# Patient Record
Sex: Female | Born: 1945 | Hispanic: Refuse to answer | Marital: Married | Smoking: Never smoker
Health system: Southern US, Community
[De-identification: ages and names within clinical notes are randomized; demographics above are authoritative.]

## PROBLEM LIST (undated history)

## (undated) DIAGNOSIS — E785 Hyperlipidemia, unspecified: Secondary | ICD-10-CM

## (undated) DIAGNOSIS — I10 Essential (primary) hypertension: Secondary | ICD-10-CM

## (undated) HISTORY — PX: BREAST EXCISIONAL BIOPSY: SUR124

## (undated) HISTORY — DX: Essential (primary) hypertension: I10

## (undated) HISTORY — DX: Hyperlipidemia, unspecified: E78.5

---

## 1971-02-10 HISTORY — PX: BREAST EXCISIONAL BIOPSY: SUR124

## 1998-02-09 HISTORY — PX: ABDOMINAL HYSTERECTOMY: SHX81

## 2008-02-10 HISTORY — PX: COLONOSCOPY: SHX174

## 2010-03-05 ENCOUNTER — Other Ambulatory Visit: Payer: Self-pay | Admitting: Internal Medicine

## 2010-03-05 DIAGNOSIS — Z1239 Encounter for other screening for malignant neoplasm of breast: Secondary | ICD-10-CM

## 2010-03-13 ENCOUNTER — Ambulatory Visit: Payer: Self-pay

## 2010-03-18 ENCOUNTER — Ambulatory Visit: Payer: Self-pay

## 2010-03-26 ENCOUNTER — Ambulatory Visit: Payer: Self-pay

## 2010-07-15 ENCOUNTER — Other Ambulatory Visit: Payer: Self-pay | Admitting: Family Medicine

## 2010-07-15 DIAGNOSIS — Z1239 Encounter for other screening for malignant neoplasm of breast: Secondary | ICD-10-CM

## 2010-07-15 DIAGNOSIS — Z780 Asymptomatic menopausal state: Secondary | ICD-10-CM

## 2010-07-23 ENCOUNTER — Other Ambulatory Visit: Payer: Self-pay

## 2010-07-23 ENCOUNTER — Ambulatory Visit
Admission: RE | Admit: 2010-07-23 | Discharge: 2010-07-23 | Disposition: A | Discharge status: Home / Self Care | Payer: BC Managed Care – PPO | Source: Ambulatory Visit | Attending: Family Medicine | Admitting: Family Medicine

## 2010-07-23 DIAGNOSIS — Z1239 Encounter for other screening for malignant neoplasm of breast: Secondary | ICD-10-CM

## 2011-10-22 ENCOUNTER — Other Ambulatory Visit: Payer: Self-pay | Admitting: Family Medicine

## 2011-10-22 DIAGNOSIS — Z780 Asymptomatic menopausal state: Secondary | ICD-10-CM

## 2011-10-22 DIAGNOSIS — Z1231 Encounter for screening mammogram for malignant neoplasm of breast: Secondary | ICD-10-CM

## 2011-11-17 ENCOUNTER — Other Ambulatory Visit: Payer: BC Managed Care – PPO

## 2011-11-17 ENCOUNTER — Ambulatory Visit: Payer: BC Managed Care – PPO

## 2011-12-15 ENCOUNTER — Ambulatory Visit
Admission: RE | Admit: 2011-12-15 | Discharge: 2011-12-15 | Disposition: A | Discharge status: Home / Self Care | Payer: Managed Care, Other (non HMO) | Source: Ambulatory Visit | Attending: Family Medicine | Admitting: Family Medicine

## 2011-12-15 DIAGNOSIS — Z1231 Encounter for screening mammogram for malignant neoplasm of breast: Secondary | ICD-10-CM

## 2011-12-15 DIAGNOSIS — Z780 Asymptomatic menopausal state: Secondary | ICD-10-CM

## 2012-11-28 ENCOUNTER — Other Ambulatory Visit: Payer: Self-pay | Admitting: Family Medicine

## 2012-11-28 DIAGNOSIS — Z1231 Encounter for screening mammogram for malignant neoplasm of breast: Secondary | ICD-10-CM

## 2012-12-16 ENCOUNTER — Ambulatory Visit
Admission: RE | Admit: 2012-12-16 | Discharge: 2012-12-16 | Disposition: A | Discharge status: Home / Self Care | Payer: Medicare Other | Source: Ambulatory Visit | Attending: Family Medicine | Admitting: Family Medicine

## 2012-12-16 DIAGNOSIS — Z1231 Encounter for screening mammogram for malignant neoplasm of breast: Secondary | ICD-10-CM

## 2013-02-09 HISTORY — PX: MENISCUS REPAIR: SHX5179

## 2013-12-26 ENCOUNTER — Other Ambulatory Visit: Payer: Self-pay

## 2013-12-26 DIAGNOSIS — Z1231 Encounter for screening mammogram for malignant neoplasm of breast: Secondary | ICD-10-CM

## 2013-12-27 ENCOUNTER — Ambulatory Visit
Admission: RE | Admit: 2013-12-27 | Discharge: 2013-12-27 | Disposition: A | Discharge status: Home / Self Care | Payer: Commercial Managed Care - HMO | Source: Ambulatory Visit

## 2013-12-27 DIAGNOSIS — Z1231 Encounter for screening mammogram for malignant neoplasm of breast: Secondary | ICD-10-CM

## 2014-04-09 DIAGNOSIS — I10 Essential (primary) hypertension: Secondary | ICD-10-CM | POA: Diagnosis not present

## 2014-04-09 DIAGNOSIS — F330 Major depressive disorder, recurrent, mild: Secondary | ICD-10-CM | POA: Diagnosis not present

## 2014-04-09 DIAGNOSIS — R0789 Other chest pain: Secondary | ICD-10-CM | POA: Diagnosis not present

## 2014-04-09 DIAGNOSIS — R5383 Other fatigue: Secondary | ICD-10-CM | POA: Diagnosis not present

## 2014-04-11 DIAGNOSIS — R9431 Abnormal electrocardiogram [ECG] [EKG]: Secondary | ICD-10-CM | POA: Diagnosis not present

## 2014-04-11 DIAGNOSIS — R079 Chest pain, unspecified: Secondary | ICD-10-CM | POA: Diagnosis not present

## 2014-04-11 DIAGNOSIS — E782 Mixed hyperlipidemia: Secondary | ICD-10-CM | POA: Diagnosis not present

## 2014-04-13 DIAGNOSIS — R002 Palpitations: Secondary | ICD-10-CM | POA: Diagnosis not present

## 2014-04-13 DIAGNOSIS — R0789 Other chest pain: Secondary | ICD-10-CM | POA: Diagnosis not present

## 2014-04-13 DIAGNOSIS — R9431 Abnormal electrocardiogram [ECG] [EKG]: Secondary | ICD-10-CM | POA: Diagnosis not present

## 2014-04-13 DIAGNOSIS — I10 Essential (primary) hypertension: Secondary | ICD-10-CM | POA: Diagnosis not present

## 2014-05-03 DIAGNOSIS — L821 Other seborrheic keratosis: Secondary | ICD-10-CM | POA: Diagnosis not present

## 2014-05-08 DIAGNOSIS — H40013 Open angle with borderline findings, low risk, bilateral: Secondary | ICD-10-CM | POA: Diagnosis not present

## 2014-06-26 DIAGNOSIS — F330 Major depressive disorder, recurrent, mild: Secondary | ICD-10-CM | POA: Diagnosis not present

## 2014-06-26 DIAGNOSIS — E782 Mixed hyperlipidemia: Secondary | ICD-10-CM | POA: Diagnosis not present

## 2014-06-26 DIAGNOSIS — I10 Essential (primary) hypertension: Secondary | ICD-10-CM | POA: Diagnosis not present

## 2014-06-26 DIAGNOSIS — R0789 Other chest pain: Secondary | ICD-10-CM | POA: Diagnosis not present

## 2014-09-10 DIAGNOSIS — I10 Essential (primary) hypertension: Secondary | ICD-10-CM | POA: Diagnosis not present

## 2014-09-10 DIAGNOSIS — R32 Unspecified urinary incontinence: Secondary | ICD-10-CM | POA: Diagnosis not present

## 2014-09-10 DIAGNOSIS — N952 Postmenopausal atrophic vaginitis: Secondary | ICD-10-CM | POA: Diagnosis not present

## 2014-12-19 DIAGNOSIS — E782 Mixed hyperlipidemia: Secondary | ICD-10-CM | POA: Diagnosis not present

## 2014-12-19 DIAGNOSIS — Z0000 Encounter for general adult medical examination without abnormal findings: Secondary | ICD-10-CM | POA: Diagnosis not present

## 2014-12-19 DIAGNOSIS — M7072 Other bursitis of hip, left hip: Secondary | ICD-10-CM | POA: Diagnosis not present

## 2014-12-19 DIAGNOSIS — M791 Myalgia: Secondary | ICD-10-CM | POA: Diagnosis not present

## 2014-12-19 DIAGNOSIS — I10 Essential (primary) hypertension: Secondary | ICD-10-CM | POA: Diagnosis not present

## 2015-01-10 DIAGNOSIS — M7072 Other bursitis of hip, left hip: Secondary | ICD-10-CM | POA: Diagnosis not present

## 2015-02-13 DIAGNOSIS — M791 Myalgia: Secondary | ICD-10-CM | POA: Diagnosis not present

## 2015-02-13 DIAGNOSIS — E782 Mixed hyperlipidemia: Secondary | ICD-10-CM | POA: Diagnosis not present

## 2015-02-13 DIAGNOSIS — I10 Essential (primary) hypertension: Secondary | ICD-10-CM | POA: Diagnosis not present

## 2015-02-15 DIAGNOSIS — M169 Osteoarthritis of hip, unspecified: Secondary | ICD-10-CM | POA: Diagnosis not present

## 2015-02-15 DIAGNOSIS — E782 Mixed hyperlipidemia: Secondary | ICD-10-CM | POA: Diagnosis not present

## 2015-02-15 DIAGNOSIS — I10 Essential (primary) hypertension: Secondary | ICD-10-CM | POA: Diagnosis not present

## 2015-02-22 ENCOUNTER — Other Ambulatory Visit: Payer: Self-pay

## 2015-02-22 DIAGNOSIS — Z1231 Encounter for screening mammogram for malignant neoplasm of breast: Secondary | ICD-10-CM

## 2015-03-06 ENCOUNTER — Ambulatory Visit: Payer: Commercial Managed Care - HMO

## 2015-03-13 ENCOUNTER — Ambulatory Visit
Admission: RE | Admit: 2015-03-13 | Discharge: 2015-03-13 | Disposition: A | Discharge status: Home / Self Care | Payer: Commercial Managed Care - HMO | Source: Ambulatory Visit

## 2015-03-13 DIAGNOSIS — Z1231 Encounter for screening mammogram for malignant neoplasm of breast: Secondary | ICD-10-CM

## 2015-05-08 DIAGNOSIS — I10 Essential (primary) hypertension: Secondary | ICD-10-CM | POA: Diagnosis not present

## 2015-05-08 DIAGNOSIS — E782 Mixed hyperlipidemia: Secondary | ICD-10-CM | POA: Diagnosis not present

## 2015-05-10 DIAGNOSIS — H40013 Open angle with borderline findings, low risk, bilateral: Secondary | ICD-10-CM | POA: Diagnosis not present

## 2015-05-10 DIAGNOSIS — H04123 Dry eye syndrome of bilateral lacrimal glands: Secondary | ICD-10-CM | POA: Diagnosis not present

## 2015-05-10 DIAGNOSIS — H16223 Keratoconjunctivitis sicca, not specified as Sjogren's, bilateral: Secondary | ICD-10-CM | POA: Diagnosis not present

## 2015-07-01 DIAGNOSIS — M7072 Other bursitis of hip, left hip: Secondary | ICD-10-CM | POA: Diagnosis not present

## 2015-07-01 DIAGNOSIS — E782 Mixed hyperlipidemia: Secondary | ICD-10-CM | POA: Diagnosis not present

## 2015-08-07 DIAGNOSIS — E782 Mixed hyperlipidemia: Secondary | ICD-10-CM | POA: Diagnosis not present

## 2015-08-09 DIAGNOSIS — I10 Essential (primary) hypertension: Secondary | ICD-10-CM | POA: Diagnosis not present

## 2015-08-09 DIAGNOSIS — E782 Mixed hyperlipidemia: Secondary | ICD-10-CM | POA: Diagnosis not present

## 2015-08-09 DIAGNOSIS — J309 Allergic rhinitis, unspecified: Secondary | ICD-10-CM | POA: Diagnosis not present

## 2015-09-02 DIAGNOSIS — J309 Allergic rhinitis, unspecified: Secondary | ICD-10-CM | POA: Diagnosis not present

## 2015-09-02 DIAGNOSIS — J329 Chronic sinusitis, unspecified: Secondary | ICD-10-CM | POA: Diagnosis not present

## 2015-09-02 DIAGNOSIS — H04123 Dry eye syndrome of bilateral lacrimal glands: Secondary | ICD-10-CM | POA: Diagnosis not present

## 2015-09-12 DIAGNOSIS — R350 Frequency of micturition: Secondary | ICD-10-CM | POA: Diagnosis not present

## 2015-09-12 DIAGNOSIS — R300 Dysuria: Secondary | ICD-10-CM | POA: Diagnosis not present

## 2015-09-12 DIAGNOSIS — R8299 Other abnormal findings in urine: Secondary | ICD-10-CM | POA: Diagnosis not present

## 2015-10-24 DIAGNOSIS — K5792 Diverticulitis of intestine, part unspecified, without perforation or abscess without bleeding: Secondary | ICD-10-CM | POA: Diagnosis not present

## 2015-10-24 DIAGNOSIS — Z23 Encounter for immunization: Secondary | ICD-10-CM | POA: Diagnosis not present

## 2015-10-28 DIAGNOSIS — K5792 Diverticulitis of intestine, part unspecified, without perforation or abscess without bleeding: Secondary | ICD-10-CM | POA: Diagnosis not present

## 2015-11-05 DIAGNOSIS — I10 Essential (primary) hypertension: Secondary | ICD-10-CM | POA: Diagnosis not present

## 2015-11-05 DIAGNOSIS — E782 Mixed hyperlipidemia: Secondary | ICD-10-CM | POA: Diagnosis not present

## 2015-11-07 DIAGNOSIS — E782 Mixed hyperlipidemia: Secondary | ICD-10-CM | POA: Diagnosis not present

## 2015-11-07 DIAGNOSIS — I10 Essential (primary) hypertension: Secondary | ICD-10-CM | POA: Diagnosis not present

## 2015-11-07 DIAGNOSIS — J309 Allergic rhinitis, unspecified: Secondary | ICD-10-CM | POA: Diagnosis not present

## 2015-12-13 DIAGNOSIS — Z6836 Body mass index (BMI) 36.0-36.9, adult: Secondary | ICD-10-CM | POA: Diagnosis not present

## 2015-12-13 DIAGNOSIS — J309 Allergic rhinitis, unspecified: Secondary | ICD-10-CM | POA: Diagnosis not present

## 2016-01-14 DIAGNOSIS — H04123 Dry eye syndrome of bilateral lacrimal glands: Secondary | ICD-10-CM | POA: Diagnosis not present

## 2016-01-14 DIAGNOSIS — I10 Essential (primary) hypertension: Secondary | ICD-10-CM | POA: Diagnosis not present

## 2016-01-14 DIAGNOSIS — Z6836 Body mass index (BMI) 36.0-36.9, adult: Secondary | ICD-10-CM | POA: Diagnosis not present

## 2016-01-14 DIAGNOSIS — Z23 Encounter for immunization: Secondary | ICD-10-CM | POA: Diagnosis not present

## 2016-01-14 DIAGNOSIS — J309 Allergic rhinitis, unspecified: Secondary | ICD-10-CM | POA: Diagnosis not present

## 2016-01-14 DIAGNOSIS — Z0000 Encounter for general adult medical examination without abnormal findings: Secondary | ICD-10-CM | POA: Diagnosis not present

## 2016-01-27 DIAGNOSIS — Z6836 Body mass index (BMI) 36.0-36.9, adult: Secondary | ICD-10-CM | POA: Diagnosis not present

## 2016-01-27 DIAGNOSIS — R05 Cough: Secondary | ICD-10-CM | POA: Diagnosis not present

## 2016-03-20 DIAGNOSIS — B349 Viral infection, unspecified: Secondary | ICD-10-CM | POA: Diagnosis not present

## 2016-03-20 DIAGNOSIS — N390 Urinary tract infection, site not specified: Secondary | ICD-10-CM | POA: Diagnosis not present

## 2016-05-04 ENCOUNTER — Other Ambulatory Visit: Payer: Self-pay | Admitting: Family Medicine

## 2016-05-04 DIAGNOSIS — Z1231 Encounter for screening mammogram for malignant neoplasm of breast: Secondary | ICD-10-CM

## 2016-05-27 ENCOUNTER — Ambulatory Visit
Admission: RE | Admit: 2016-05-27 | Discharge: 2016-05-27 | Disposition: A | Discharge status: Home / Self Care | Payer: Medicare HMO | Source: Ambulatory Visit | Attending: Family Medicine | Admitting: Family Medicine

## 2016-05-27 DIAGNOSIS — Z1231 Encounter for screening mammogram for malignant neoplasm of breast: Secondary | ICD-10-CM | POA: Diagnosis not present

## 2016-07-15 DIAGNOSIS — E782 Mixed hyperlipidemia: Secondary | ICD-10-CM | POA: Diagnosis not present

## 2016-07-15 DIAGNOSIS — I10 Essential (primary) hypertension: Secondary | ICD-10-CM | POA: Diagnosis not present

## 2016-07-17 DIAGNOSIS — I10 Essential (primary) hypertension: Secondary | ICD-10-CM | POA: Diagnosis not present

## 2016-07-17 DIAGNOSIS — M7072 Other bursitis of hip, left hip: Secondary | ICD-10-CM | POA: Diagnosis not present

## 2016-07-17 DIAGNOSIS — E782 Mixed hyperlipidemia: Secondary | ICD-10-CM | POA: Diagnosis not present

## 2016-07-17 DIAGNOSIS — Z23 Encounter for immunization: Secondary | ICD-10-CM | POA: Diagnosis not present

## 2016-11-05 DIAGNOSIS — E782 Mixed hyperlipidemia: Secondary | ICD-10-CM | POA: Diagnosis not present

## 2016-11-09 DIAGNOSIS — I10 Essential (primary) hypertension: Secondary | ICD-10-CM | POA: Diagnosis not present

## 2016-11-09 DIAGNOSIS — Z6836 Body mass index (BMI) 36.0-36.9, adult: Secondary | ICD-10-CM | POA: Diagnosis not present

## 2016-11-09 DIAGNOSIS — Z23 Encounter for immunization: Secondary | ICD-10-CM | POA: Diagnosis not present

## 2016-11-09 DIAGNOSIS — E782 Mixed hyperlipidemia: Secondary | ICD-10-CM | POA: Diagnosis not present

## 2016-11-27 DIAGNOSIS — H16223 Keratoconjunctivitis sicca, not specified as Sjogren's, bilateral: Secondary | ICD-10-CM | POA: Diagnosis not present

## 2016-11-27 DIAGNOSIS — H40013 Open angle with borderline findings, low risk, bilateral: Secondary | ICD-10-CM | POA: Diagnosis not present

## 2016-12-15 DIAGNOSIS — K5792 Diverticulitis of intestine, part unspecified, without perforation or abscess without bleeding: Secondary | ICD-10-CM | POA: Diagnosis not present

## 2016-12-15 DIAGNOSIS — Z6836 Body mass index (BMI) 36.0-36.9, adult: Secondary | ICD-10-CM | POA: Diagnosis not present

## 2016-12-17 DIAGNOSIS — K5792 Diverticulitis of intestine, part unspecified, without perforation or abscess without bleeding: Secondary | ICD-10-CM | POA: Diagnosis not present

## 2017-01-15 DIAGNOSIS — E782 Mixed hyperlipidemia: Secondary | ICD-10-CM | POA: Diagnosis not present

## 2017-01-15 DIAGNOSIS — I10 Essential (primary) hypertension: Secondary | ICD-10-CM | POA: Diagnosis not present

## 2017-01-15 DIAGNOSIS — E559 Vitamin D deficiency, unspecified: Secondary | ICD-10-CM | POA: Diagnosis not present

## 2017-01-21 ENCOUNTER — Other Ambulatory Visit: Payer: Self-pay | Admitting: Family Medicine

## 2017-01-21 DIAGNOSIS — Z1231 Encounter for screening mammogram for malignant neoplasm of breast: Secondary | ICD-10-CM

## 2017-01-21 DIAGNOSIS — E782 Mixed hyperlipidemia: Secondary | ICD-10-CM | POA: Diagnosis not present

## 2017-01-21 DIAGNOSIS — Z6836 Body mass index (BMI) 36.0-36.9, adult: Secondary | ICD-10-CM | POA: Diagnosis not present

## 2017-01-21 DIAGNOSIS — E2839 Other primary ovarian failure: Secondary | ICD-10-CM

## 2017-01-21 DIAGNOSIS — R112 Nausea with vomiting, unspecified: Secondary | ICD-10-CM

## 2017-01-21 DIAGNOSIS — E559 Vitamin D deficiency, unspecified: Secondary | ICD-10-CM | POA: Diagnosis not present

## 2017-01-21 DIAGNOSIS — Z1211 Encounter for screening for malignant neoplasm of colon: Secondary | ICD-10-CM | POA: Diagnosis not present

## 2017-01-21 DIAGNOSIS — I10 Essential (primary) hypertension: Secondary | ICD-10-CM | POA: Diagnosis not present

## 2017-01-21 DIAGNOSIS — Z23 Encounter for immunization: Secondary | ICD-10-CM | POA: Diagnosis not present

## 2017-01-21 DIAGNOSIS — R109 Unspecified abdominal pain: Secondary | ICD-10-CM

## 2017-01-21 DIAGNOSIS — Z0000 Encounter for general adult medical examination without abnormal findings: Secondary | ICD-10-CM | POA: Diagnosis not present

## 2017-01-22 ENCOUNTER — Ambulatory Visit
Admission: RE | Admit: 2017-01-22 | Discharge: 2017-01-22 | Disposition: A | Discharge status: Home / Self Care | Payer: Medicare HMO | Source: Ambulatory Visit | Attending: Family Medicine | Admitting: Family Medicine

## 2017-01-22 DIAGNOSIS — R112 Nausea with vomiting, unspecified: Secondary | ICD-10-CM

## 2017-01-22 DIAGNOSIS — R109 Unspecified abdominal pain: Secondary | ICD-10-CM

## 2017-01-22 DIAGNOSIS — K8020 Calculus of gallbladder without cholecystitis without obstruction: Secondary | ICD-10-CM | POA: Diagnosis not present

## 2017-01-25 ENCOUNTER — Other Ambulatory Visit: Payer: Medicare HMO

## 2017-01-26 DIAGNOSIS — K8020 Calculus of gallbladder without cholecystitis without obstruction: Secondary | ICD-10-CM | POA: Diagnosis not present

## 2017-01-26 DIAGNOSIS — R1030 Lower abdominal pain, unspecified: Secondary | ICD-10-CM | POA: Diagnosis not present

## 2017-01-26 DIAGNOSIS — Z6836 Body mass index (BMI) 36.0-36.9, adult: Secondary | ICD-10-CM | POA: Diagnosis not present

## 2017-01-26 DIAGNOSIS — I10 Essential (primary) hypertension: Secondary | ICD-10-CM | POA: Diagnosis not present

## 2017-02-17 DIAGNOSIS — R109 Unspecified abdominal pain: Secondary | ICD-10-CM | POA: Diagnosis not present

## 2017-03-22 DIAGNOSIS — Z6836 Body mass index (BMI) 36.0-36.9, adult: Secondary | ICD-10-CM | POA: Diagnosis not present

## 2017-03-22 DIAGNOSIS — K8020 Calculus of gallbladder without cholecystitis without obstruction: Secondary | ICD-10-CM | POA: Diagnosis not present

## 2017-03-22 DIAGNOSIS — R1030 Lower abdominal pain, unspecified: Secondary | ICD-10-CM | POA: Diagnosis not present

## 2017-03-30 DIAGNOSIS — H401231 Low-tension glaucoma, bilateral, mild stage: Secondary | ICD-10-CM | POA: Diagnosis not present

## 2017-04-13 DIAGNOSIS — M1611 Unilateral primary osteoarthritis, right hip: Secondary | ICD-10-CM | POA: Diagnosis not present

## 2017-04-13 DIAGNOSIS — M25551 Pain in right hip: Secondary | ICD-10-CM | POA: Diagnosis not present

## 2017-04-13 DIAGNOSIS — M25561 Pain in right knee: Secondary | ICD-10-CM | POA: Diagnosis not present

## 2017-04-13 DIAGNOSIS — M1711 Unilateral primary osteoarthritis, right knee: Secondary | ICD-10-CM | POA: Diagnosis not present

## 2017-04-21 DIAGNOSIS — E782 Mixed hyperlipidemia: Secondary | ICD-10-CM | POA: Diagnosis not present

## 2017-04-26 DIAGNOSIS — H409 Unspecified glaucoma: Secondary | ICD-10-CM | POA: Diagnosis not present

## 2017-04-26 DIAGNOSIS — E782 Mixed hyperlipidemia: Secondary | ICD-10-CM | POA: Diagnosis not present

## 2017-04-26 DIAGNOSIS — I10 Essential (primary) hypertension: Secondary | ICD-10-CM | POA: Diagnosis not present

## 2017-04-26 DIAGNOSIS — Z6836 Body mass index (BMI) 36.0-36.9, adult: Secondary | ICD-10-CM | POA: Diagnosis not present

## 2017-04-26 DIAGNOSIS — K8020 Calculus of gallbladder without cholecystitis without obstruction: Secondary | ICD-10-CM | POA: Diagnosis not present

## 2017-05-03 DIAGNOSIS — H409 Unspecified glaucoma: Secondary | ICD-10-CM | POA: Diagnosis not present

## 2017-05-03 DIAGNOSIS — K8020 Calculus of gallbladder without cholecystitis without obstruction: Secondary | ICD-10-CM | POA: Diagnosis not present

## 2017-05-03 DIAGNOSIS — I10 Essential (primary) hypertension: Secondary | ICD-10-CM | POA: Diagnosis not present

## 2017-05-03 DIAGNOSIS — K219 Gastro-esophageal reflux disease without esophagitis: Secondary | ICD-10-CM | POA: Diagnosis not present

## 2017-05-20 ENCOUNTER — Other Ambulatory Visit: Payer: Self-pay | Admitting: General Surgery

## 2017-05-20 DIAGNOSIS — K8020 Calculus of gallbladder without cholecystitis without obstruction: Secondary | ICD-10-CM | POA: Diagnosis not present

## 2017-05-28 ENCOUNTER — Ambulatory Visit: Payer: Medicare HMO

## 2017-05-28 ENCOUNTER — Other Ambulatory Visit: Payer: Medicare HMO

## 2017-06-07 DIAGNOSIS — I10 Essential (primary) hypertension: Secondary | ICD-10-CM | POA: Diagnosis not present

## 2017-06-07 DIAGNOSIS — Z6836 Body mass index (BMI) 36.0-36.9, adult: Secondary | ICD-10-CM | POA: Diagnosis not present

## 2017-06-07 DIAGNOSIS — K219 Gastro-esophageal reflux disease without esophagitis: Secondary | ICD-10-CM | POA: Diagnosis not present

## 2017-06-07 DIAGNOSIS — K8020 Calculus of gallbladder without cholecystitis without obstruction: Secondary | ICD-10-CM | POA: Diagnosis not present

## 2017-06-25 ENCOUNTER — Ambulatory Visit
Admission: RE | Admit: 2017-06-25 | Discharge: 2017-06-25 | Disposition: A | Discharge status: Home / Self Care | Payer: Medicare HMO | Source: Ambulatory Visit | Attending: Family Medicine | Admitting: Family Medicine

## 2017-06-25 DIAGNOSIS — Z1231 Encounter for screening mammogram for malignant neoplasm of breast: Secondary | ICD-10-CM

## 2017-06-25 DIAGNOSIS — M8589 Other specified disorders of bone density and structure, multiple sites: Secondary | ICD-10-CM | POA: Diagnosis not present

## 2017-06-25 DIAGNOSIS — E2839 Other primary ovarian failure: Secondary | ICD-10-CM

## 2017-06-25 DIAGNOSIS — Z780 Asymptomatic menopausal state: Secondary | ICD-10-CM | POA: Diagnosis not present

## 2017-07-29 DIAGNOSIS — Z01818 Encounter for other preprocedural examination: Secondary | ICD-10-CM | POA: Diagnosis not present

## 2017-08-03 DIAGNOSIS — H2513 Age-related nuclear cataract, bilateral: Secondary | ICD-10-CM | POA: Diagnosis not present

## 2017-08-03 DIAGNOSIS — H43813 Vitreous degeneration, bilateral: Secondary | ICD-10-CM | POA: Diagnosis not present

## 2017-08-03 DIAGNOSIS — H40013 Open angle with borderline findings, low risk, bilateral: Secondary | ICD-10-CM | POA: Diagnosis not present

## 2017-08-05 DIAGNOSIS — K8010 Calculus of gallbladder with chronic cholecystitis without obstruction: Secondary | ICD-10-CM | POA: Diagnosis not present

## 2017-08-16 DIAGNOSIS — S91111A Laceration without foreign body of right great toe without damage to nail, initial encounter: Secondary | ICD-10-CM | POA: Diagnosis not present

## 2017-08-24 DIAGNOSIS — Z6838 Body mass index (BMI) 38.0-38.9, adult: Secondary | ICD-10-CM | POA: Diagnosis not present

## 2017-08-24 DIAGNOSIS — B373 Candidiasis of vulva and vagina: Secondary | ICD-10-CM | POA: Diagnosis not present

## 2017-08-27 DIAGNOSIS — E782 Mixed hyperlipidemia: Secondary | ICD-10-CM | POA: Diagnosis not present

## 2017-08-27 DIAGNOSIS — S91131A Puncture wound without foreign body of right great toe without damage to nail, initial encounter: Secondary | ICD-10-CM | POA: Diagnosis not present

## 2017-08-27 DIAGNOSIS — Z6836 Body mass index (BMI) 36.0-36.9, adult: Secondary | ICD-10-CM | POA: Diagnosis not present

## 2017-08-27 DIAGNOSIS — S91103D Unspecified open wound of unspecified great toe without damage to nail, subsequent encounter: Secondary | ICD-10-CM | POA: Diagnosis not present

## 2017-08-27 DIAGNOSIS — I10 Essential (primary) hypertension: Secondary | ICD-10-CM | POA: Diagnosis not present

## 2017-09-06 DIAGNOSIS — I10 Essential (primary) hypertension: Secondary | ICD-10-CM | POA: Diagnosis not present

## 2017-09-06 DIAGNOSIS — Z6837 Body mass index (BMI) 37.0-37.9, adult: Secondary | ICD-10-CM | POA: Diagnosis not present

## 2017-09-06 DIAGNOSIS — E782 Mixed hyperlipidemia: Secondary | ICD-10-CM | POA: Diagnosis not present

## 2017-09-06 DIAGNOSIS — Z23 Encounter for immunization: Secondary | ICD-10-CM | POA: Diagnosis not present

## 2017-09-06 DIAGNOSIS — K219 Gastro-esophageal reflux disease without esophagitis: Secondary | ICD-10-CM | POA: Diagnosis not present

## 2017-11-01 DIAGNOSIS — I10 Essential (primary) hypertension: Secondary | ICD-10-CM | POA: Diagnosis not present

## 2017-11-01 DIAGNOSIS — Z6836 Body mass index (BMI) 36.0-36.9, adult: Secondary | ICD-10-CM | POA: Diagnosis not present

## 2017-11-01 DIAGNOSIS — K219 Gastro-esophageal reflux disease without esophagitis: Secondary | ICD-10-CM | POA: Diagnosis not present

## 2017-11-01 DIAGNOSIS — L821 Other seborrheic keratosis: Secondary | ICD-10-CM | POA: Diagnosis not present

## 2017-11-01 DIAGNOSIS — Z23 Encounter for immunization: Secondary | ICD-10-CM | POA: Diagnosis not present

## 2017-11-01 DIAGNOSIS — G5603 Carpal tunnel syndrome, bilateral upper limbs: Secondary | ICD-10-CM | POA: Diagnosis not present

## 2017-11-01 DIAGNOSIS — N393 Stress incontinence (female) (male): Secondary | ICD-10-CM | POA: Diagnosis not present

## 2017-12-03 DIAGNOSIS — H40013 Open angle with borderline findings, low risk, bilateral: Secondary | ICD-10-CM | POA: Diagnosis not present

## 2018-01-24 DIAGNOSIS — K5792 Diverticulitis of intestine, part unspecified, without perforation or abscess without bleeding: Secondary | ICD-10-CM | POA: Diagnosis not present

## 2018-01-24 DIAGNOSIS — Z0000 Encounter for general adult medical examination without abnormal findings: Secondary | ICD-10-CM | POA: Diagnosis not present

## 2018-01-24 DIAGNOSIS — N393 Stress incontinence (female) (male): Secondary | ICD-10-CM | POA: Diagnosis not present

## 2018-01-24 DIAGNOSIS — Z6836 Body mass index (BMI) 36.0-36.9, adult: Secondary | ICD-10-CM | POA: Diagnosis not present

## 2018-01-28 DIAGNOSIS — R110 Nausea: Secondary | ICD-10-CM | POA: Diagnosis not present

## 2018-01-28 DIAGNOSIS — K5792 Diverticulitis of intestine, part unspecified, without perforation or abscess without bleeding: Secondary | ICD-10-CM | POA: Diagnosis not present

## 2018-01-28 DIAGNOSIS — Z6837 Body mass index (BMI) 37.0-37.9, adult: Secondary | ICD-10-CM | POA: Diagnosis not present

## 2018-02-22 DIAGNOSIS — Z1211 Encounter for screening for malignant neoplasm of colon: Secondary | ICD-10-CM | POA: Diagnosis not present

## 2018-02-22 DIAGNOSIS — K5792 Diverticulitis of intestine, part unspecified, without perforation or abscess without bleeding: Secondary | ICD-10-CM | POA: Diagnosis not present

## 2018-02-22 DIAGNOSIS — Z6836 Body mass index (BMI) 36.0-36.9, adult: Secondary | ICD-10-CM | POA: Diagnosis not present

## 2018-06-07 DIAGNOSIS — M549 Dorsalgia, unspecified: Secondary | ICD-10-CM | POA: Diagnosis not present

## 2018-06-07 DIAGNOSIS — I10 Essential (primary) hypertension: Secondary | ICD-10-CM | POA: Diagnosis not present

## 2018-06-07 DIAGNOSIS — E782 Mixed hyperlipidemia: Secondary | ICD-10-CM | POA: Diagnosis not present

## 2018-06-29 DIAGNOSIS — M5431 Sciatica, right side: Secondary | ICD-10-CM | POA: Diagnosis not present

## 2018-06-29 DIAGNOSIS — Z719 Counseling, unspecified: Secondary | ICD-10-CM | POA: Diagnosis not present

## 2018-06-29 DIAGNOSIS — M549 Dorsalgia, unspecified: Secondary | ICD-10-CM | POA: Diagnosis not present

## 2018-07-12 DIAGNOSIS — M5431 Sciatica, right side: Secondary | ICD-10-CM | POA: Diagnosis not present

## 2018-09-06 DIAGNOSIS — E559 Vitamin D deficiency, unspecified: Secondary | ICD-10-CM | POA: Diagnosis not present

## 2018-09-06 DIAGNOSIS — E782 Mixed hyperlipidemia: Secondary | ICD-10-CM | POA: Diagnosis not present

## 2018-09-06 DIAGNOSIS — I10 Essential (primary) hypertension: Secondary | ICD-10-CM | POA: Diagnosis not present

## 2018-09-13 DIAGNOSIS — J309 Allergic rhinitis, unspecified: Secondary | ICD-10-CM | POA: Diagnosis not present

## 2018-09-13 DIAGNOSIS — E782 Mixed hyperlipidemia: Secondary | ICD-10-CM | POA: Diagnosis not present

## 2018-09-13 DIAGNOSIS — E559 Vitamin D deficiency, unspecified: Secondary | ICD-10-CM | POA: Diagnosis not present

## 2018-09-13 DIAGNOSIS — I10 Essential (primary) hypertension: Secondary | ICD-10-CM | POA: Diagnosis not present

## 2018-09-14 ENCOUNTER — Other Ambulatory Visit: Payer: Self-pay

## 2018-09-14 DIAGNOSIS — Z20822 Contact with and (suspected) exposure to covid-19: Secondary | ICD-10-CM

## 2018-09-15 LAB — NOVEL CORONAVIRUS, NAA: SARS-CoV-2, NAA: NOT DETECTED

## 2018-09-22 DIAGNOSIS — M65342 Trigger finger, left ring finger: Secondary | ICD-10-CM | POA: Diagnosis not present

## 2018-10-20 DIAGNOSIS — Z23 Encounter for immunization: Secondary | ICD-10-CM | POA: Diagnosis not present

## 2018-10-20 DIAGNOSIS — M65342 Trigger finger, left ring finger: Secondary | ICD-10-CM | POA: Diagnosis not present

## 2019-01-23 DIAGNOSIS — Z6836 Body mass index (BMI) 36.0-36.9, adult: Secondary | ICD-10-CM | POA: Diagnosis not present

## 2019-01-23 DIAGNOSIS — K219 Gastro-esophageal reflux disease without esophagitis: Secondary | ICD-10-CM | POA: Diagnosis not present

## 2019-01-23 DIAGNOSIS — J309 Allergic rhinitis, unspecified: Secondary | ICD-10-CM | POA: Diagnosis not present

## 2019-01-23 DIAGNOSIS — E559 Vitamin D deficiency, unspecified: Secondary | ICD-10-CM | POA: Diagnosis not present

## 2019-01-23 DIAGNOSIS — I10 Essential (primary) hypertension: Secondary | ICD-10-CM | POA: Diagnosis not present

## 2019-02-06 DIAGNOSIS — M7542 Impingement syndrome of left shoulder: Secondary | ICD-10-CM | POA: Diagnosis not present

## 2019-02-06 DIAGNOSIS — Z0000 Encounter for general adult medical examination without abnormal findings: Secondary | ICD-10-CM | POA: Diagnosis not present

## 2019-02-06 DIAGNOSIS — M549 Dorsalgia, unspecified: Secondary | ICD-10-CM | POA: Diagnosis not present

## 2019-02-06 DIAGNOSIS — Z6836 Body mass index (BMI) 36.0-36.9, adult: Secondary | ICD-10-CM | POA: Diagnosis not present

## 2019-02-14 DIAGNOSIS — M65342 Trigger finger, left ring finger: Secondary | ICD-10-CM | POA: Diagnosis not present

## 2019-02-23 DIAGNOSIS — I10 Essential (primary) hypertension: Secondary | ICD-10-CM | POA: Diagnosis not present

## 2019-02-23 DIAGNOSIS — K5792 Diverticulitis of intestine, part unspecified, without perforation or abscess without bleeding: Secondary | ICD-10-CM | POA: Diagnosis not present

## 2019-02-23 DIAGNOSIS — Z20828 Contact with and (suspected) exposure to other viral communicable diseases: Secondary | ICD-10-CM | POA: Diagnosis not present

## 2019-02-24 DIAGNOSIS — Z20828 Contact with and (suspected) exposure to other viral communicable diseases: Secondary | ICD-10-CM | POA: Diagnosis not present

## 2019-02-27 DIAGNOSIS — K5792 Diverticulitis of intestine, part unspecified, without perforation or abscess without bleeding: Secondary | ICD-10-CM | POA: Diagnosis not present

## 2019-03-01 ENCOUNTER — Ambulatory Visit: Payer: Medicare HMO | Attending: Internal Medicine

## 2019-03-01 DIAGNOSIS — Z1211 Encounter for screening for malignant neoplasm of colon: Secondary | ICD-10-CM | POA: Diagnosis not present

## 2019-03-01 DIAGNOSIS — Z23 Encounter for immunization: Secondary | ICD-10-CM | POA: Insufficient documentation

## 2019-03-06 DIAGNOSIS — M7542 Impingement syndrome of left shoulder: Secondary | ICD-10-CM | POA: Diagnosis not present

## 2019-03-06 DIAGNOSIS — I10 Essential (primary) hypertension: Secondary | ICD-10-CM | POA: Diagnosis not present

## 2019-03-06 DIAGNOSIS — Z719 Counseling, unspecified: Secondary | ICD-10-CM | POA: Diagnosis not present

## 2019-03-06 DIAGNOSIS — K5792 Diverticulitis of intestine, part unspecified, without perforation or abscess without bleeding: Secondary | ICD-10-CM | POA: Diagnosis not present

## 2019-03-22 ENCOUNTER — Ambulatory Visit: Payer: Medicare HMO | Attending: Internal Medicine

## 2019-03-22 DIAGNOSIS — Z23 Encounter for immunization: Secondary | ICD-10-CM | POA: Insufficient documentation

## 2019-03-22 NOTE — Progress Notes (Signed)
   Covid-19 Vaccination Clinic  Name:  Kaitlin Young    MRN: [id number] DOB: October 03, 1945  03/22/2019  [name] was observed post Covid-19 immunization for 15 minutes without incidence. She was provided with Vaccine Information Sheet and instruction to access the V-Safe system.   [name] was instructed to call 911 with any severe reactions post vaccine: Marland Kitchen Difficulty breathing  . Swelling of your face and throat  . A fast heartbeat  . A bad rash all over your body  . Dizziness and weakness    Immunizations Administered    Name Date Dose VIS Date Route   Pfizer COVID-19 Vaccine 03/22/2019 11:43 AM 0.3 mL 01/20/2019 Intramuscular   Manufacturer: [place]   Lot: [id number]   [place]: [id number]

## 2019-04-03 ENCOUNTER — Ambulatory Visit: Payer: Medicare HMO

## 2019-04-05 DIAGNOSIS — N390 Urinary tract infection, site not specified: Secondary | ICD-10-CM | POA: Diagnosis not present

## 2019-04-05 DIAGNOSIS — N952 Postmenopausal atrophic vaginitis: Secondary | ICD-10-CM | POA: Diagnosis not present

## 2019-05-22 ENCOUNTER — Other Ambulatory Visit: Payer: Self-pay | Admitting: Family Medicine

## 2019-05-22 DIAGNOSIS — Z1231 Encounter for screening mammogram for malignant neoplasm of breast: Secondary | ICD-10-CM

## 2019-05-22 DIAGNOSIS — H2513 Age-related nuclear cataract, bilateral: Secondary | ICD-10-CM | POA: Diagnosis not present

## 2019-05-22 DIAGNOSIS — H40013 Open angle with borderline findings, low risk, bilateral: Secondary | ICD-10-CM | POA: Diagnosis not present

## 2019-06-21 ENCOUNTER — Ambulatory Visit
Admission: RE | Admit: 2019-06-21 | Discharge: 2019-06-21 | Disposition: A | Discharge status: Home / Self Care | Payer: Medicare HMO | Source: Ambulatory Visit | Attending: Family Medicine | Admitting: Family Medicine

## 2019-06-21 ENCOUNTER — Other Ambulatory Visit: Payer: Self-pay

## 2019-06-21 DIAGNOSIS — Z1231 Encounter for screening mammogram for malignant neoplasm of breast: Secondary | ICD-10-CM

## 2019-06-22 ENCOUNTER — Ambulatory Visit: Payer: Medicare HMO

## 2019-09-05 DIAGNOSIS — E782 Mixed hyperlipidemia: Secondary | ICD-10-CM | POA: Diagnosis not present

## 2019-09-05 DIAGNOSIS — E559 Vitamin D deficiency, unspecified: Secondary | ICD-10-CM | POA: Diagnosis not present

## 2019-09-05 DIAGNOSIS — I10 Essential (primary) hypertension: Secondary | ICD-10-CM | POA: Diagnosis not present

## 2019-09-11 DIAGNOSIS — M25512 Pain in left shoulder: Secondary | ICD-10-CM | POA: Diagnosis not present

## 2019-09-11 DIAGNOSIS — M47812 Spondylosis without myelopathy or radiculopathy, cervical region: Secondary | ICD-10-CM | POA: Diagnosis not present

## 2019-09-13 DIAGNOSIS — E782 Mixed hyperlipidemia: Secondary | ICD-10-CM | POA: Diagnosis not present

## 2019-09-13 DIAGNOSIS — I10 Essential (primary) hypertension: Secondary | ICD-10-CM | POA: Diagnosis not present

## 2019-09-13 DIAGNOSIS — M169 Osteoarthritis of hip, unspecified: Secondary | ICD-10-CM | POA: Diagnosis not present

## 2019-09-13 DIAGNOSIS — E559 Vitamin D deficiency, unspecified: Secondary | ICD-10-CM | POA: Diagnosis not present

## 2019-09-13 DIAGNOSIS — J309 Allergic rhinitis, unspecified: Secondary | ICD-10-CM | POA: Diagnosis not present

## 2019-09-13 DIAGNOSIS — M7582 Other shoulder lesions, left shoulder: Secondary | ICD-10-CM | POA: Diagnosis not present

## 2019-09-13 DIAGNOSIS — M47812 Spondylosis without myelopathy or radiculopathy, cervical region: Secondary | ICD-10-CM | POA: Diagnosis not present

## 2019-09-26 DIAGNOSIS — R42 Dizziness and giddiness: Secondary | ICD-10-CM | POA: Diagnosis not present

## 2019-09-26 DIAGNOSIS — M542 Cervicalgia: Secondary | ICD-10-CM | POA: Diagnosis not present

## 2019-09-26 DIAGNOSIS — M7582 Other shoulder lesions, left shoulder: Secondary | ICD-10-CM | POA: Diagnosis not present

## 2019-09-26 DIAGNOSIS — H6121 Impacted cerumen, right ear: Secondary | ICD-10-CM | POA: Diagnosis not present

## 2019-11-09 DIAGNOSIS — I10 Essential (primary) hypertension: Secondary | ICD-10-CM | POA: Diagnosis not present

## 2019-11-09 DIAGNOSIS — E782 Mixed hyperlipidemia: Secondary | ICD-10-CM | POA: Diagnosis not present

## 2019-11-09 DIAGNOSIS — E559 Vitamin D deficiency, unspecified: Secondary | ICD-10-CM | POA: Diagnosis not present

## 2019-11-13 DIAGNOSIS — G2581 Restless legs syndrome: Secondary | ICD-10-CM | POA: Diagnosis not present

## 2019-11-13 DIAGNOSIS — I10 Essential (primary) hypertension: Secondary | ICD-10-CM | POA: Diagnosis not present

## 2019-11-13 DIAGNOSIS — E559 Vitamin D deficiency, unspecified: Secondary | ICD-10-CM | POA: Diagnosis not present

## 2019-11-13 DIAGNOSIS — E782 Mixed hyperlipidemia: Secondary | ICD-10-CM | POA: Diagnosis not present

## 2019-11-22 DIAGNOSIS — Z23 Encounter for immunization: Secondary | ICD-10-CM | POA: Diagnosis not present

## 2019-11-22 DIAGNOSIS — H40013 Open angle with borderline findings, low risk, bilateral: Secondary | ICD-10-CM | POA: Diagnosis not present

## 2020-02-21 DIAGNOSIS — Z1331 Encounter for screening for depression: Secondary | ICD-10-CM | POA: Diagnosis not present

## 2020-02-21 DIAGNOSIS — I10 Essential (primary) hypertension: Secondary | ICD-10-CM | POA: Diagnosis not present

## 2020-02-21 DIAGNOSIS — E782 Mixed hyperlipidemia: Secondary | ICD-10-CM | POA: Diagnosis not present

## 2020-02-21 DIAGNOSIS — M791 Myalgia, unspecified site: Secondary | ICD-10-CM | POA: Diagnosis not present

## 2020-02-21 DIAGNOSIS — Z1339 Encounter for screening examination for other mental health and behavioral disorders: Secondary | ICD-10-CM | POA: Diagnosis not present

## 2020-02-21 DIAGNOSIS — E559 Vitamin D deficiency, unspecified: Secondary | ICD-10-CM | POA: Diagnosis not present

## 2020-02-21 DIAGNOSIS — Z0000 Encounter for general adult medical examination without abnormal findings: Secondary | ICD-10-CM | POA: Diagnosis not present

## 2020-02-21 DIAGNOSIS — E669 Obesity, unspecified: Secondary | ICD-10-CM | POA: Diagnosis not present

## 2020-02-28 ENCOUNTER — Other Ambulatory Visit: Payer: Self-pay | Admitting: Family Medicine

## 2020-02-28 DIAGNOSIS — E559 Vitamin D deficiency, unspecified: Secondary | ICD-10-CM

## 2020-02-28 DIAGNOSIS — E2839 Other primary ovarian failure: Secondary | ICD-10-CM

## 2020-03-07 DIAGNOSIS — N3941 Urge incontinence: Secondary | ICD-10-CM | POA: Diagnosis not present

## 2020-03-07 DIAGNOSIS — E669 Obesity, unspecified: Secondary | ICD-10-CM | POA: Diagnosis not present

## 2020-03-07 DIAGNOSIS — N393 Stress incontinence (female) (male): Secondary | ICD-10-CM | POA: Diagnosis not present

## 2020-03-07 DIAGNOSIS — R32 Unspecified urinary incontinence: Secondary | ICD-10-CM | POA: Diagnosis not present

## 2020-03-07 DIAGNOSIS — N3946 Mixed incontinence: Secondary | ICD-10-CM | POA: Diagnosis not present

## 2020-03-21 DIAGNOSIS — K219 Gastro-esophageal reflux disease without esophagitis: Secondary | ICD-10-CM | POA: Diagnosis not present

## 2020-03-21 DIAGNOSIS — E782 Mixed hyperlipidemia: Secondary | ICD-10-CM | POA: Diagnosis not present

## 2020-03-21 DIAGNOSIS — E6609 Other obesity due to excess calories: Secondary | ICD-10-CM | POA: Diagnosis not present

## 2020-03-21 DIAGNOSIS — I10 Essential (primary) hypertension: Secondary | ICD-10-CM | POA: Diagnosis not present

## 2020-03-21 DIAGNOSIS — Z6839 Body mass index (BMI) 39.0-39.9, adult: Secondary | ICD-10-CM | POA: Diagnosis not present

## 2020-04-10 DIAGNOSIS — M2550 Pain in unspecified joint: Secondary | ICD-10-CM | POA: Diagnosis not present

## 2020-04-10 DIAGNOSIS — J069 Acute upper respiratory infection, unspecified: Secondary | ICD-10-CM | POA: Diagnosis not present

## 2020-04-10 DIAGNOSIS — J309 Allergic rhinitis, unspecified: Secondary | ICD-10-CM | POA: Diagnosis not present

## 2020-04-10 DIAGNOSIS — R32 Unspecified urinary incontinence: Secondary | ICD-10-CM | POA: Diagnosis not present

## 2020-04-10 DIAGNOSIS — M7582 Other shoulder lesions, left shoulder: Secondary | ICD-10-CM | POA: Diagnosis not present

## 2020-04-16 DIAGNOSIS — Z6839 Body mass index (BMI) 39.0-39.9, adult: Secondary | ICD-10-CM | POA: Diagnosis not present

## 2020-04-16 DIAGNOSIS — E6609 Other obesity due to excess calories: Secondary | ICD-10-CM | POA: Diagnosis not present

## 2020-04-16 DIAGNOSIS — E782 Mixed hyperlipidemia: Secondary | ICD-10-CM | POA: Diagnosis not present

## 2020-04-16 DIAGNOSIS — K219 Gastro-esophageal reflux disease without esophagitis: Secondary | ICD-10-CM | POA: Diagnosis not present

## 2020-04-16 DIAGNOSIS — I10 Essential (primary) hypertension: Secondary | ICD-10-CM | POA: Diagnosis not present

## 2020-04-23 DIAGNOSIS — E6609 Other obesity due to excess calories: Secondary | ICD-10-CM | POA: Diagnosis not present

## 2020-04-23 DIAGNOSIS — Z6839 Body mass index (BMI) 39.0-39.9, adult: Secondary | ICD-10-CM | POA: Diagnosis not present

## 2020-04-29 ENCOUNTER — Other Ambulatory Visit: Payer: Self-pay | Admitting: Family Medicine

## 2020-04-29 ENCOUNTER — Ambulatory Visit
Admission: RE | Admit: 2020-04-29 | Discharge: 2020-04-29 | Disposition: A | Discharge status: Home / Self Care | Payer: Medicare HMO | Source: Ambulatory Visit | Attending: Family Medicine | Admitting: Family Medicine

## 2020-04-29 DIAGNOSIS — E559 Vitamin D deficiency, unspecified: Secondary | ICD-10-CM | POA: Diagnosis not present

## 2020-04-29 DIAGNOSIS — R5383 Other fatigue: Secondary | ICD-10-CM | POA: Diagnosis not present

## 2020-04-29 DIAGNOSIS — I10 Essential (primary) hypertension: Secondary | ICD-10-CM | POA: Diagnosis not present

## 2020-04-29 DIAGNOSIS — R52 Pain, unspecified: Secondary | ICD-10-CM

## 2020-04-29 DIAGNOSIS — M170 Bilateral primary osteoarthritis of knee: Secondary | ICD-10-CM

## 2020-04-29 DIAGNOSIS — M1712 Unilateral primary osteoarthritis, left knee: Secondary | ICD-10-CM | POA: Diagnosis not present

## 2020-04-29 DIAGNOSIS — M1711 Unilateral primary osteoarthritis, right knee: Secondary | ICD-10-CM | POA: Diagnosis not present

## 2020-04-29 DIAGNOSIS — E782 Mixed hyperlipidemia: Secondary | ICD-10-CM | POA: Diagnosis not present

## 2020-04-29 DIAGNOSIS — M25512 Pain in left shoulder: Secondary | ICD-10-CM | POA: Diagnosis not present

## 2020-05-06 DIAGNOSIS — E782 Mixed hyperlipidemia: Secondary | ICD-10-CM | POA: Diagnosis not present

## 2020-05-06 DIAGNOSIS — I10 Essential (primary) hypertension: Secondary | ICD-10-CM | POA: Diagnosis not present

## 2020-05-06 DIAGNOSIS — Z789 Other specified health status: Secondary | ICD-10-CM | POA: Diagnosis not present

## 2020-05-06 DIAGNOSIS — E559 Vitamin D deficiency, unspecified: Secondary | ICD-10-CM | POA: Diagnosis not present

## 2020-05-06 DIAGNOSIS — J309 Allergic rhinitis, unspecified: Secondary | ICD-10-CM | POA: Diagnosis not present

## 2020-05-06 DIAGNOSIS — R32 Unspecified urinary incontinence: Secondary | ICD-10-CM | POA: Diagnosis not present

## 2020-05-07 DIAGNOSIS — E782 Mixed hyperlipidemia: Secondary | ICD-10-CM | POA: Diagnosis not present

## 2020-05-07 DIAGNOSIS — E6609 Other obesity due to excess calories: Secondary | ICD-10-CM | POA: Diagnosis not present

## 2020-05-07 DIAGNOSIS — I10 Essential (primary) hypertension: Secondary | ICD-10-CM | POA: Diagnosis not present

## 2020-05-07 DIAGNOSIS — Z6839 Body mass index (BMI) 39.0-39.9, adult: Secondary | ICD-10-CM | POA: Diagnosis not present

## 2020-05-09 DIAGNOSIS — M170 Bilateral primary osteoarthritis of knee: Secondary | ICD-10-CM | POA: Diagnosis not present

## 2020-05-09 DIAGNOSIS — M7542 Impingement syndrome of left shoulder: Secondary | ICD-10-CM | POA: Diagnosis not present

## 2020-05-20 DIAGNOSIS — Z6839 Body mass index (BMI) 39.0-39.9, adult: Secondary | ICD-10-CM | POA: Diagnosis not present

## 2020-05-20 DIAGNOSIS — E6609 Other obesity due to excess calories: Secondary | ICD-10-CM | POA: Diagnosis not present

## 2020-05-20 DIAGNOSIS — E782 Mixed hyperlipidemia: Secondary | ICD-10-CM | POA: Diagnosis not present

## 2020-05-20 DIAGNOSIS — I10 Essential (primary) hypertension: Secondary | ICD-10-CM | POA: Diagnosis not present

## 2020-05-21 DIAGNOSIS — H43813 Vitreous degeneration, bilateral: Secondary | ICD-10-CM | POA: Diagnosis not present

## 2020-05-21 DIAGNOSIS — H40013 Open angle with borderline findings, low risk, bilateral: Secondary | ICD-10-CM | POA: Diagnosis not present

## 2020-05-21 DIAGNOSIS — H25013 Cortical age-related cataract, bilateral: Secondary | ICD-10-CM | POA: Diagnosis not present

## 2020-05-21 DIAGNOSIS — H2513 Age-related nuclear cataract, bilateral: Secondary | ICD-10-CM | POA: Diagnosis not present

## 2020-06-11 ENCOUNTER — Other Ambulatory Visit: Payer: Self-pay | Admitting: Family Medicine

## 2020-06-11 DIAGNOSIS — Z1231 Encounter for screening mammogram for malignant neoplasm of breast: Secondary | ICD-10-CM

## 2020-06-26 ENCOUNTER — Other Ambulatory Visit: Payer: Self-pay

## 2020-06-26 ENCOUNTER — Ambulatory Visit
Admission: RE | Admit: 2020-06-26 | Discharge: 2020-06-26 | Disposition: A | Discharge status: Home / Self Care | Payer: Medicare HMO | Source: Ambulatory Visit | Attending: Family Medicine | Admitting: Family Medicine

## 2020-06-26 DIAGNOSIS — E2839 Other primary ovarian failure: Secondary | ICD-10-CM

## 2020-06-26 DIAGNOSIS — M8589 Other specified disorders of bone density and structure, multiple sites: Secondary | ICD-10-CM | POA: Diagnosis not present

## 2020-06-26 DIAGNOSIS — E559 Vitamin D deficiency, unspecified: Secondary | ICD-10-CM

## 2020-06-26 DIAGNOSIS — Z780 Asymptomatic menopausal state: Secondary | ICD-10-CM | POA: Diagnosis not present

## 2020-08-01 ENCOUNTER — Ambulatory Visit
Admission: RE | Admit: 2020-08-01 | Discharge: 2020-08-01 | Disposition: A | Discharge status: Home / Self Care | Payer: Medicare HMO | Source: Ambulatory Visit | Attending: Family Medicine | Admitting: Family Medicine

## 2020-08-01 ENCOUNTER — Other Ambulatory Visit: Payer: Self-pay

## 2020-08-01 DIAGNOSIS — Z1231 Encounter for screening mammogram for malignant neoplasm of breast: Secondary | ICD-10-CM | POA: Diagnosis not present

## 2020-08-13 DIAGNOSIS — M25519 Pain in unspecified shoulder: Secondary | ICD-10-CM | POA: Diagnosis not present

## 2020-08-13 DIAGNOSIS — M8580 Other specified disorders of bone density and structure, unspecified site: Secondary | ICD-10-CM | POA: Diagnosis not present

## 2020-08-13 DIAGNOSIS — R32 Unspecified urinary incontinence: Secondary | ICD-10-CM | POA: Diagnosis not present

## 2020-08-13 DIAGNOSIS — M7542 Impingement syndrome of left shoulder: Secondary | ICD-10-CM | POA: Diagnosis not present

## 2020-08-13 DIAGNOSIS — E559 Vitamin D deficiency, unspecified: Secondary | ICD-10-CM | POA: Diagnosis not present

## 2020-10-01 DIAGNOSIS — Z23 Encounter for immunization: Secondary | ICD-10-CM | POA: Diagnosis not present

## 2020-10-01 DIAGNOSIS — M7542 Impingement syndrome of left shoulder: Secondary | ICD-10-CM | POA: Diagnosis not present

## 2020-10-31 DIAGNOSIS — E782 Mixed hyperlipidemia: Secondary | ICD-10-CM | POA: Diagnosis not present

## 2020-10-31 DIAGNOSIS — I10 Essential (primary) hypertension: Secondary | ICD-10-CM | POA: Diagnosis not present

## 2020-10-31 DIAGNOSIS — E559 Vitamin D deficiency, unspecified: Secondary | ICD-10-CM | POA: Diagnosis not present

## 2020-10-31 DIAGNOSIS — D519 Vitamin B12 deficiency anemia, unspecified: Secondary | ICD-10-CM | POA: Diagnosis not present

## 2020-11-05 DIAGNOSIS — M542 Cervicalgia: Secondary | ICD-10-CM | POA: Diagnosis not present

## 2020-11-05 DIAGNOSIS — Z789 Other specified health status: Secondary | ICD-10-CM | POA: Diagnosis not present

## 2020-11-05 DIAGNOSIS — E559 Vitamin D deficiency, unspecified: Secondary | ICD-10-CM | POA: Diagnosis not present

## 2020-11-05 DIAGNOSIS — M7582 Other shoulder lesions, left shoulder: Secondary | ICD-10-CM | POA: Diagnosis not present

## 2020-11-05 DIAGNOSIS — M7542 Impingement syndrome of left shoulder: Secondary | ICD-10-CM | POA: Diagnosis not present

## 2020-11-05 DIAGNOSIS — E782 Mixed hyperlipidemia: Secondary | ICD-10-CM | POA: Diagnosis not present

## 2020-11-05 DIAGNOSIS — I10 Essential (primary) hypertension: Secondary | ICD-10-CM | POA: Diagnosis not present

## 2020-11-05 DIAGNOSIS — D519 Vitamin B12 deficiency anemia, unspecified: Secondary | ICD-10-CM | POA: Diagnosis not present

## 2020-11-21 DIAGNOSIS — M7542 Impingement syndrome of left shoulder: Secondary | ICD-10-CM | POA: Diagnosis not present

## 2020-11-27 DIAGNOSIS — M25512 Pain in left shoulder: Secondary | ICD-10-CM | POA: Diagnosis not present

## 2020-12-04 DIAGNOSIS — M6281 Muscle weakness (generalized): Secondary | ICD-10-CM | POA: Diagnosis not present

## 2020-12-04 DIAGNOSIS — M7542 Impingement syndrome of left shoulder: Secondary | ICD-10-CM | POA: Diagnosis not present

## 2020-12-09 DIAGNOSIS — M6281 Muscle weakness (generalized): Secondary | ICD-10-CM | POA: Diagnosis not present

## 2020-12-09 DIAGNOSIS — M7542 Impingement syndrome of left shoulder: Secondary | ICD-10-CM | POA: Diagnosis not present

## 2020-12-12 DIAGNOSIS — M6281 Muscle weakness (generalized): Secondary | ICD-10-CM | POA: Diagnosis not present

## 2020-12-12 DIAGNOSIS — M7542 Impingement syndrome of left shoulder: Secondary | ICD-10-CM | POA: Diagnosis not present

## 2020-12-18 DIAGNOSIS — M6281 Muscle weakness (generalized): Secondary | ICD-10-CM | POA: Diagnosis not present

## 2020-12-18 DIAGNOSIS — M7542 Impingement syndrome of left shoulder: Secondary | ICD-10-CM | POA: Diagnosis not present

## 2020-12-20 DIAGNOSIS — M7542 Impingement syndrome of left shoulder: Secondary | ICD-10-CM | POA: Diagnosis not present

## 2020-12-20 DIAGNOSIS — M6281 Muscle weakness (generalized): Secondary | ICD-10-CM | POA: Diagnosis not present

## 2020-12-25 DIAGNOSIS — M7542 Impingement syndrome of left shoulder: Secondary | ICD-10-CM | POA: Diagnosis not present

## 2020-12-25 DIAGNOSIS — M6281 Muscle weakness (generalized): Secondary | ICD-10-CM | POA: Diagnosis not present

## 2020-12-30 DIAGNOSIS — M7542 Impingement syndrome of left shoulder: Secondary | ICD-10-CM | POA: Diagnosis not present

## 2020-12-30 DIAGNOSIS — M6281 Muscle weakness (generalized): Secondary | ICD-10-CM | POA: Diagnosis not present

## 2021-01-17 DIAGNOSIS — M25512 Pain in left shoulder: Secondary | ICD-10-CM | POA: Diagnosis not present

## 2021-01-20 DIAGNOSIS — M7542 Impingement syndrome of left shoulder: Secondary | ICD-10-CM | POA: Diagnosis not present

## 2021-01-20 DIAGNOSIS — M6281 Muscle weakness (generalized): Secondary | ICD-10-CM | POA: Diagnosis not present

## 2021-01-29 DIAGNOSIS — M6281 Muscle weakness (generalized): Secondary | ICD-10-CM | POA: Diagnosis not present

## 2021-01-29 DIAGNOSIS — M7542 Impingement syndrome of left shoulder: Secondary | ICD-10-CM | POA: Diagnosis not present

## 2021-01-30 DIAGNOSIS — D519 Vitamin B12 deficiency anemia, unspecified: Secondary | ICD-10-CM | POA: Diagnosis not present

## 2021-01-30 DIAGNOSIS — D649 Anemia, unspecified: Secondary | ICD-10-CM | POA: Diagnosis not present

## 2021-01-30 DIAGNOSIS — E782 Mixed hyperlipidemia: Secondary | ICD-10-CM | POA: Diagnosis not present

## 2021-02-06 DIAGNOSIS — G720 Drug-induced myopathy: Secondary | ICD-10-CM | POA: Diagnosis not present

## 2021-02-06 DIAGNOSIS — K219 Gastro-esophageal reflux disease without esophagitis: Secondary | ICD-10-CM | POA: Diagnosis not present

## 2021-02-06 DIAGNOSIS — I10 Essential (primary) hypertension: Secondary | ICD-10-CM | POA: Diagnosis not present

## 2021-02-06 DIAGNOSIS — M7542 Impingement syndrome of left shoulder: Secondary | ICD-10-CM | POA: Diagnosis not present

## 2021-02-06 DIAGNOSIS — E782 Mixed hyperlipidemia: Secondary | ICD-10-CM | POA: Diagnosis not present

## 2021-02-25 DIAGNOSIS — Z1331 Encounter for screening for depression: Secondary | ICD-10-CM | POA: Diagnosis not present

## 2021-02-25 DIAGNOSIS — Z0000 Encounter for general adult medical examination without abnormal findings: Secondary | ICD-10-CM | POA: Diagnosis not present

## 2021-02-25 DIAGNOSIS — Z1339 Encounter for screening examination for other mental health and behavioral disorders: Secondary | ICD-10-CM | POA: Diagnosis not present

## 2021-03-17 ENCOUNTER — Encounter: Payer: Self-pay | Admitting: Gastroenterology

## 2021-03-17 DIAGNOSIS — Z7189 Other specified counseling: Secondary | ICD-10-CM | POA: Diagnosis not present

## 2021-03-17 DIAGNOSIS — K219 Gastro-esophageal reflux disease without esophagitis: Secondary | ICD-10-CM | POA: Diagnosis not present

## 2021-03-17 DIAGNOSIS — Z23 Encounter for immunization: Secondary | ICD-10-CM | POA: Diagnosis not present

## 2021-03-17 DIAGNOSIS — Z2821 Immunization not carried out because of patient refusal: Secondary | ICD-10-CM | POA: Diagnosis not present

## 2021-03-17 DIAGNOSIS — Z1211 Encounter for screening for malignant neoplasm of colon: Secondary | ICD-10-CM | POA: Diagnosis not present

## 2021-03-17 DIAGNOSIS — R1032 Left lower quadrant pain: Secondary | ICD-10-CM | POA: Diagnosis not present

## 2021-03-19 DIAGNOSIS — K5792 Diverticulitis of intestine, part unspecified, without perforation or abscess without bleeding: Secondary | ICD-10-CM | POA: Diagnosis not present

## 2021-04-01 ENCOUNTER — Telehealth: Payer: Self-pay

## 2021-04-01 NOTE — Telephone Encounter (Signed)
Reached pt to obtain Ht/Wt to determine BMI.  She reported Ht 5'2; wt 216 lbs; Calculated BMI 39.5.  Pt thanked for information and appt for PV call confirmed.

## 2021-04-09 ENCOUNTER — Ambulatory Visit (AMBULATORY_SURGERY_CENTER): Payer: Medicare HMO | Admitting: *Deleted

## 2021-04-09 ENCOUNTER — Other Ambulatory Visit: Payer: Self-pay

## 2021-04-09 VITALS — Ht 62.0 in | Wt 216.0 lb

## 2021-04-09 DIAGNOSIS — Z1211 Encounter for screening for malignant neoplasm of colon: Secondary | ICD-10-CM

## 2021-04-09 NOTE — Progress Notes (Signed)
No egg or soy allergy known to patient  ?No issues known to pt with past sedation with any surgeries or procedures ?Patient denies ever being told they had issues or difficulty with intubation  ?No FH of Malignant Hyperthermia ?Pt is not on diet pills ?Pt is not on  home 02  ?Pt is not on blood thinners  ?Pt denies issues with constipation  ?No A fib or A flutter ? ?Pt is fully vaccinated  for Covid  ? ? ?Due to the COVID-19 pandemic we are asking patients to follow certain guidelines in PV and the [place]   ?Pt aware of COVID protocols and [facility] guidelines  ? ?PV completed over the phone. Pt verified name, DOB, address and insurance during PV today.  ?Pt mailed instruction packet with copy of consent form to read and not return, and instructions.  ?Pt encouraged to call with questions or issues.  ? ?

## 2021-04-18 ENCOUNTER — Encounter: Payer: Self-pay | Admitting: Gastroenterology

## 2021-04-23 ENCOUNTER — Encounter: Payer: Self-pay | Admitting: Gastroenterology

## 2021-04-23 ENCOUNTER — Ambulatory Visit (AMBULATORY_SURGERY_CENTER): Payer: Medicare HMO | Admitting: Gastroenterology

## 2021-04-23 ENCOUNTER — Other Ambulatory Visit: Payer: Self-pay | Admitting: Gastroenterology

## 2021-04-23 ENCOUNTER — Other Ambulatory Visit: Payer: Self-pay

## 2021-04-23 VITALS — BP 121/67 | HR 60 | Temp 98.0°F | Resp 16 | Ht 62.0 in | Wt 216.0 lb

## 2021-04-23 DIAGNOSIS — D120 Benign neoplasm of cecum: Secondary | ICD-10-CM | POA: Diagnosis not present

## 2021-04-23 DIAGNOSIS — Z1211 Encounter for screening for malignant neoplasm of colon: Secondary | ICD-10-CM | POA: Diagnosis not present

## 2021-04-23 MED ORDER — SODIUM CHLORIDE 0.9 % IV SOLN: 500.0000 mL | Freq: Once | INTRAVENOUS | Status: DC

## 2021-04-23 NOTE — Progress Notes (Signed)
To pacu, VSS. Report to RN.tb 

## 2021-04-23 NOTE — Progress Notes (Signed)
VS- Kaitlin Young  Pt's states no medical or surgical changes since previsit or office visit.  

## 2021-04-23 NOTE — Op Note (Signed)
[place] ?Patient Name: Kaitlin Young ?Procedure Date: 04/23/2021 8:27 AM ?MRN: [id number] ?Endoscopist: Jackquline Denmark , MD ?Age: 76 ?Referring MD:  ?Date of Birth: Jul 02, 1945 ?Gender: Female ?Account #: [id number] ?Procedure:                Colonoscopy ?Indications:              Screening for colorectal malignant neoplasm ?Medicines:                Monitored Anesthesia Care ?Procedure:                Pre-Anesthesia Assessment: ?                          - Prior to the procedure, a History and Physical  ?                          was performed, and patient medications and  ?                          allergies were reviewed. The patient's tolerance of  ?                          previous anesthesia was also reviewed. The risks  ?                          and benefits of the procedure and the sedation  ?                          options and risks were discussed with the patient.  ?                          All questions were answered, and informed consent  ?                          was obtained. Prior Anticoagulants: The patient has  ?                          taken no previous anticoagulant or antiplatelet  ?                          agents. ASA Grade Assessment: II - A patient with  ?                          mild systemic disease. After reviewing the risks  ?                          and benefits, the patient was deemed in  ?                          satisfactory condition to undergo the procedure. ?                          After obtaining informed consent, the colonoscope  ?  was passed under direct vision. Throughout the  ?                          procedure, the patient's blood pressure, pulse, and  ?                          oxygen saturations were monitored continuously. The  ?                          Olympus CF-[id number] (#[id number]) Colonoscope was  ?                          introduced through the anus and advanced to the 2  ?                          cm into the ileum. The  colonoscopy was performed  ?                          without difficulty. The patient tolerated the  ?                          procedure well. The quality of the bowel  ?                          preparation was good. The terminal ileum, ileocecal  ?                          valve, appendiceal orifice, and rectum were  ?                          photographed. ?Scope In: 8:34:45 AM ?Scope Out: 8:48:41 AM ?Scope Withdrawal Time: 0 hours 10 minutes 2 seconds  ?Total Procedure Duration: 0 hours 13 minutes 56 seconds  ?Findings:                 Two sessile polyps were found in the cecum. The  ?                          polyps were 2 to 4 mm in size. These polyps were  ?                          removed with a cold snare. Resection and retrieval  ?                          were complete. ?                          Multiple medium-mouthed diverticula were found in  ?                          the sigmoid colon and few in descending colon. ?                          Non-bleeding internal hemorrhoids were found during  ?  retroflexion. The hemorrhoids were small and Grade  ?                          I (internal hemorrhoids that do not prolapse). ?                          The terminal ileum appeared normal. ?                          The exam was otherwise without abnormality on  ?                          direct and retroflexion views. ?Complications:            No immediate complications. ?Estimated Blood Loss:     Estimated blood loss: none. ?Impression:               - Two 2 to 4 mm polyps in the cecum, removed with a  ?                          cold snare. Resected and retrieved. ?                          - Moderate predominantly sigmoid diverticulosis. ?                          - Non-bleeding internal hemorrhoids. ?                          - The examined portion of the ileum was normal. ?                          - The examination was otherwise normal on direct  ?                          and  retroflexion views. ?Recommendation:           - Patient has a contact number available for  ?                          emergencies. The signs and symptoms of potential  ?                          delayed complications were discussed with the  ?                          patient. Return to normal activities tomorrow.  ?                          Written discharge instructions were provided to the  ?                          patient. ?                          - High fiber diet. ?                          -  Continue present medications. ?                          - Await pathology results. ?                          - Repeat colonoscopy is not recommended for  ?                          surveillance. Hence repeat colonoscopy only if with  ?                          new problems. ?                          - The findings and recommendations were discussed  ?                          with the patient's family. ?Jackquline Denmark, MD ?04/23/2021 8:52:34 AM ?This report has been signed electronically. ?

## 2021-04-23 NOTE — Progress Notes (Signed)
[facility] History and Physical ? ? ?Primary Care Physician:  Fanny Bien, MD ? ? ?Reason for Procedure:   Colorectal cancer screening ? ?Plan:     colonoscopy ? ? ? ? ?HPI: Kaitlin Young is a 76 y.o. female  ? ? ?Past Medical History:  ?Diagnosis Date  ? Hyperlipidemia   ? Hypertension   ? ? ?Past Surgical History:  ?Procedure Laterality Date  ? ABDOMINAL HYSTERECTOMY  2000  ? BREAST EXCISIONAL BIOPSY Right   ? COLONOSCOPY  2010  ? MENISCUS REPAIR  2015  ? ? ?Prior to Admission medications   ?Medication Sig Start Date End Date Taking? Authorizing Provider  ?Cholecalciferol 125 MCG (5000 UT) TABS Take 1 tablet by mouth daily. 10/28/11  Yes [provider]  ?ezetimibe (ZETIA) 10 MG tablet Take 10 mg by mouth daily. 03/27/21  Yes [provider]  ?montelukast (SINGULAIR) 10 MG tablet Take 10 mg by mouth daily. 01/18/21  Yes [provider]  ?olmesartan (BENICAR) 20 MG tablet Take 20 mg by mouth daily. 02/09/21  Yes [provider]  ?Omega-3 Fatty Acids (FISH OIL) 1000 MG CAPS Take by mouth.   Yes [provider]  ? ? ?Current Outpatient Medications  ?Medication Sig Dispense Refill  ? Cholecalciferol 125 MCG (5000 UT) TABS Take 1 tablet by mouth daily.    ? ezetimibe (ZETIA) 10 MG tablet Take 10 mg by mouth daily.    ? montelukast (SINGULAIR) 10 MG tablet Take 10 mg by mouth daily.    ? olmesartan (BENICAR) 20 MG tablet Take 20 mg by mouth daily.    ? Omega-3 Fatty Acids (FISH OIL) 1000 MG CAPS Take by mouth.    ? ?Current Facility-Administered Medications  ?Medication Dose Route Frequency Provider Last Rate Last Admin  ? 0.9 %  sodium chloride infusion  500 mL Intravenous Once Jackquline Denmark, MD      ? ? ?Allergies as of 04/23/2021 - Review Complete 04/23/2021  ?Allergen Reaction Noted  ? Sulfa antibiotics  07/10/2010  ? Sulfamethoxazole-trimethoprim  07/10/2010  ? ? ?Family History  ?Problem Relation Age of Onset  ? Esophageal cancer Brother   ?  Colon polyps Neg Hx   ? Colon cancer Neg Hx   ? Rectal cancer Neg Hx   ? Stomach cancer Neg Hx   ? ? ?Social History  ? ?Socioeconomic History  ? Marital status: Married  ?  Spouse name: Not on file  ? Number of children: Not on file  ? Years of education: Not on file  ? Highest education level: Not on file  ?Occupational History  ? Not on file  ?Tobacco Use  ? Smoking status: Never  ? Smokeless tobacco: Never  ?Vaping Use  ? Vaping Use: Never used  ?Substance and Sexual Activity  ? Alcohol use: Yes  ?  Comment: once in awhile  ? Drug use: Never  ? Sexual activity: Not on file  ?Other Topics Concern  ? Not on file  ?Social History Narrative  ? Not on file  ? ?Social Determinants of Health  ? ?Financial Resource Strain: Not on file  ?Food Insecurity: Not on file  ?Transportation Needs: Not on file  ?Physical Activity: Not on file  ?Stress: Not on file  ?Social Connections: Not on file  ?Intimate Partner Violence: Not on file  ? ? ?Review of Systems: ?Positive for none ?All other review of systems negative except as mentioned in the HPI. ? ?Physical Exam: ?Vital signs in last  24 hours: ?'@VSRANGES'$ @ ?  ?General:   Alert,  Well-developed, well-nourished, pleasant and cooperative in NAD ?Lungs:  Clear throughout to auscultation.   ?Heart:  Regular rate and rhythm; no murmurs, clicks, rubs,  or gallops. ?Abdomen:  Soft, nontender and nondistended. Normal bowel sounds.   ?Neuro/Psych:  Alert and cooperative. Normal mood and affect. A and O x 3 ? ? ? ?No significant changes were identified.  The patient continues to be an appropriate candidate for the planned procedure and anesthesia. ? ? ?Carmell Austria, MD. ?[facility] ?04/23/2021 8:24 AM@ ? ?

## 2021-04-23 NOTE — Patient Instructions (Signed)
? ? ?Handouts on polyps ,diverticulosis,hemorrhoids ,& high fiber diet given to you today ? ? ?Await pathology results on polyps removed  ? ? ? ?YOU HAD AN ENDOSCOPIC PROCEDURE TODAY AT [facility]:   Refer to the procedure report that was given to you for any specific questions about what was found during the examination.  If the procedure report does not answer your questions, please call your gastroenterologist to clarify.  If you requested that your care partner not be given the details of your procedure findings, then the procedure report has been included in a sealed envelope for you to review at your convenience later. ? ?YOU SHOULD EXPECT: Some feelings of bloating in the abdomen. Passage of more gas than usual.  Walking can help get rid of the air that was put into your GI tract during the procedure and reduce the bloating. If you had a lower endoscopy (such as a colonoscopy or flexible sigmoidoscopy) you may notice spotting of blood in your stool or on the toilet paper. If you underwent a bowel prep for your procedure, you may not have a normal bowel movement for a few days. ? ?Please Note:  You might notice some irritation and congestion in your nose or some drainage.  This is from the oxygen used during your procedure.  There is no need for concern and it should clear up in a day or so. ? ?SYMPTOMS TO REPORT IMMEDIATELY: ? ?Following lower endoscopy (colonoscopy or flexible sigmoidoscopy): ? Excessive amounts of blood in the stool ? Significant tenderness or worsening of abdominal pains ? Swelling of the abdomen that is new, acute ? Fever of 100?F or higher ? ? ?For urgent or emergent issues, a gastroenterologist can be reached at any hour by calling [phone]. ?Do not use MyChart messaging for urgent concerns.  ? ? ?DIET:  We do recommend a small meal at first, but then you may proceed to your regular diet.  Drink plenty of fluids but you should avoid alcoholic beverages for 24  hours. ? ?ACTIVITY:  You should plan to take it easy for the rest of today and you should NOT DRIVE or use heavy machinery until tomorrow (because of the sedation medicines used during the test).   ? ?FOLLOW UP: ?Our staff will call the number listed on your records 48-72 hours following your procedure to check on you and address any questions or concerns that you may have regarding the information given to you following your procedure. If we do not reach you, we will leave a message.  We will attempt to reach you two times.  During this call, we will ask if you have developed any symptoms of COVID 19. If you develop any symptoms (ie: fever, flu-like symptoms, shortness of breath, cough etc.) before then, please call [phone].  If you test positive for Covid 19 in the 2 weeks post procedure, please call and report this information to [place].   ? ?If any biopsies were taken you will be contacted by phone or by letter within the next 1-3 weeks.  Please call us at [phone] if you have not heard about the biopsies in 3 weeks.  ? ? ?SIGNATURES/CONFIDENTIALITY: ?You and/or your care partner have signed paperwork which will be entered into your electronic medical record.  These signatures attest to the fact that that the information above on your After Visit Summary has been reviewed and is understood.  Full responsibility of the confidentiality of this discharge  information lies with you and/or your care-partner.  ?

## 2021-04-23 NOTE — Progress Notes (Signed)
Called to room to assist during endoscopic procedure.  Patient ID and intended procedure confirmed with present staff. Received instructions for my participation in the procedure from the performing physician.  

## 2021-04-25 ENCOUNTER — Telehealth: Payer: Self-pay | Admitting: *Deleted

## 2021-04-25 NOTE — Telephone Encounter (Signed)
?  Follow up Call- ? ?Call back number 04/23/2021  ?Post procedure Call Back phone  # [phone]  ?Permission to leave phone message Yes  ?Some recent data might be hidden  ?  ? ?Patient questions: ? ?Do you have a fever, pain , or abdominal swelling? No. ?Pain Score  0 * ? ?Have you tolerated food without any problems? Yes.   ? ?Have you been able to return to your normal activities? Yes.   ? ?Do you have any questions about your discharge instructions: ?Diet   No. ?Medications  No. ?Follow up visit  No. ? ?Do you have questions or concerns about your Care? No. ? ?Actions: ?* If pain score is 4 or above: ?No action needed, pain <4. ? ? ?

## 2021-05-04 ENCOUNTER — Encounter: Payer: Self-pay | Admitting: Gastroenterology

## 2021-05-08 DIAGNOSIS — M7542 Impingement syndrome of left shoulder: Secondary | ICD-10-CM | POA: Diagnosis not present

## 2021-05-08 DIAGNOSIS — M7062 Trochanteric bursitis, left hip: Secondary | ICD-10-CM | POA: Diagnosis not present

## 2021-05-08 DIAGNOSIS — K635 Polyp of colon: Secondary | ICD-10-CM | POA: Diagnosis not present

## 2021-05-14 DIAGNOSIS — I10 Essential (primary) hypertension: Secondary | ICD-10-CM | POA: Diagnosis not present

## 2021-05-14 DIAGNOSIS — M7062 Trochanteric bursitis, left hip: Secondary | ICD-10-CM | POA: Diagnosis not present

## 2021-06-03 DIAGNOSIS — H26103 Unspecified traumatic cataract, bilateral: Secondary | ICD-10-CM | POA: Diagnosis not present

## 2021-06-03 DIAGNOSIS — M7542 Impingement syndrome of left shoulder: Secondary | ICD-10-CM | POA: Diagnosis not present

## 2021-06-03 DIAGNOSIS — H43813 Vitreous degeneration, bilateral: Secondary | ICD-10-CM | POA: Diagnosis not present

## 2021-06-03 DIAGNOSIS — H2513 Age-related nuclear cataract, bilateral: Secondary | ICD-10-CM | POA: Diagnosis not present

## 2021-06-03 DIAGNOSIS — H40013 Open angle with borderline findings, low risk, bilateral: Secondary | ICD-10-CM | POA: Diagnosis not present

## 2021-06-03 DIAGNOSIS — R252 Cramp and spasm: Secondary | ICD-10-CM | POA: Diagnosis not present

## 2021-06-03 DIAGNOSIS — M7062 Trochanteric bursitis, left hip: Secondary | ICD-10-CM | POA: Diagnosis not present

## 2021-06-03 DIAGNOSIS — I10 Essential (primary) hypertension: Secondary | ICD-10-CM | POA: Diagnosis not present

## 2021-07-03 ENCOUNTER — Other Ambulatory Visit: Payer: Self-pay | Admitting: Family Medicine

## 2021-07-03 DIAGNOSIS — Z1231 Encounter for screening mammogram for malignant neoplasm of breast: Secondary | ICD-10-CM

## 2021-08-04 ENCOUNTER — Ambulatory Visit: Payer: Medicare HMO

## 2021-08-11 ENCOUNTER — Ambulatory Visit
Admission: RE | Admit: 2021-08-11 | Discharge: 2021-08-11 | Disposition: A | Discharge status: Home / Self Care | Payer: Medicare HMO | Source: Ambulatory Visit | Attending: Family Medicine | Admitting: Family Medicine

## 2021-08-11 DIAGNOSIS — Z1231 Encounter for screening mammogram for malignant neoplasm of breast: Secondary | ICD-10-CM

## 2021-08-18 DIAGNOSIS — M25521 Pain in right elbow: Secondary | ICD-10-CM | POA: Diagnosis not present

## 2021-08-18 DIAGNOSIS — M549 Dorsalgia, unspecified: Secondary | ICD-10-CM | POA: Diagnosis not present

## 2021-08-18 DIAGNOSIS — W1830XA Fall on same level, unspecified, initial encounter: Secondary | ICD-10-CM | POA: Diagnosis not present

## 2021-08-18 DIAGNOSIS — M546 Pain in thoracic spine: Secondary | ICD-10-CM | POA: Diagnosis not present

## 2021-08-18 DIAGNOSIS — S52121A Displaced fracture of head of right radius, initial encounter for closed fracture: Secondary | ICD-10-CM | POA: Diagnosis not present

## 2021-08-20 ENCOUNTER — Ambulatory Visit
Admission: RE | Admit: 2021-08-20 | Discharge: 2021-08-20 | Disposition: A | Discharge status: Home / Self Care | Payer: Medicare HMO | Source: Ambulatory Visit | Attending: Orthopedic Surgery | Admitting: Orthopedic Surgery

## 2021-08-20 ENCOUNTER — Other Ambulatory Visit: Payer: Self-pay | Admitting: Orthopedic Surgery

## 2021-08-20 DIAGNOSIS — M25521 Pain in right elbow: Secondary | ICD-10-CM

## 2021-08-20 DIAGNOSIS — S52124A Nondisplaced fracture of head of right radius, initial encounter for closed fracture: Secondary | ICD-10-CM | POA: Diagnosis not present

## 2021-08-20 DIAGNOSIS — M25421 Effusion, right elbow: Secondary | ICD-10-CM | POA: Diagnosis not present

## 2021-08-20 DIAGNOSIS — S52044A Nondisplaced fracture of coronoid process of right ulna, initial encounter for closed fracture: Secondary | ICD-10-CM | POA: Diagnosis not present

## 2021-08-25 DIAGNOSIS — M25521 Pain in right elbow: Secondary | ICD-10-CM | POA: Diagnosis not present

## 2021-09-15 DIAGNOSIS — M25521 Pain in right elbow: Secondary | ICD-10-CM | POA: Diagnosis not present

## 2021-09-16 DIAGNOSIS — S42401A Unspecified fracture of lower end of right humerus, initial encounter for closed fracture: Secondary | ICD-10-CM | POA: Diagnosis not present

## 2021-09-16 DIAGNOSIS — I10 Essential (primary) hypertension: Secondary | ICD-10-CM | POA: Diagnosis not present

## 2021-09-16 DIAGNOSIS — M545 Low back pain, unspecified: Secondary | ICD-10-CM | POA: Diagnosis not present

## 2021-10-07 DIAGNOSIS — U071 COVID-19: Secondary | ICD-10-CM | POA: Diagnosis not present

## 2021-10-07 DIAGNOSIS — J069 Acute upper respiratory infection, unspecified: Secondary | ICD-10-CM | POA: Diagnosis not present

## 2021-10-17 DIAGNOSIS — M25521 Pain in right elbow: Secondary | ICD-10-CM | POA: Diagnosis not present

## 2021-11-04 DIAGNOSIS — E559 Vitamin D deficiency, unspecified: Secondary | ICD-10-CM | POA: Diagnosis not present

## 2021-11-04 DIAGNOSIS — I10 Essential (primary) hypertension: Secondary | ICD-10-CM | POA: Diagnosis not present

## 2021-11-04 DIAGNOSIS — E782 Mixed hyperlipidemia: Secondary | ICD-10-CM | POA: Diagnosis not present

## 2021-11-06 DIAGNOSIS — Z789 Other specified health status: Secondary | ICD-10-CM | POA: Diagnosis not present

## 2021-11-06 DIAGNOSIS — I10 Essential (primary) hypertension: Secondary | ICD-10-CM | POA: Diagnosis not present

## 2021-11-06 DIAGNOSIS — E782 Mixed hyperlipidemia: Secondary | ICD-10-CM | POA: Diagnosis not present

## 2021-11-06 DIAGNOSIS — R1032 Left lower quadrant pain: Secondary | ICD-10-CM | POA: Diagnosis not present

## 2021-11-06 DIAGNOSIS — K5900 Constipation, unspecified: Secondary | ICD-10-CM | POA: Diagnosis not present

## 2021-11-06 DIAGNOSIS — G720 Drug-induced myopathy: Secondary | ICD-10-CM | POA: Diagnosis not present

## 2021-11-06 DIAGNOSIS — E559 Vitamin D deficiency, unspecified: Secondary | ICD-10-CM | POA: Diagnosis not present

## 2021-11-17 DIAGNOSIS — R109 Unspecified abdominal pain: Secondary | ICD-10-CM | POA: Diagnosis not present

## 2021-11-17 DIAGNOSIS — K5900 Constipation, unspecified: Secondary | ICD-10-CM | POA: Diagnosis not present

## 2021-11-17 DIAGNOSIS — E782 Mixed hyperlipidemia: Secondary | ICD-10-CM | POA: Diagnosis not present

## 2021-11-17 DIAGNOSIS — Z23 Encounter for immunization: Secondary | ICD-10-CM | POA: Diagnosis not present

## 2021-11-17 DIAGNOSIS — I10 Essential (primary) hypertension: Secondary | ICD-10-CM | POA: Diagnosis not present

## 2021-12-26 DIAGNOSIS — J309 Allergic rhinitis, unspecified: Secondary | ICD-10-CM | POA: Diagnosis not present

## 2022-02-13 DIAGNOSIS — I10 Essential (primary) hypertension: Secondary | ICD-10-CM | POA: Diagnosis not present

## 2022-02-13 DIAGNOSIS — H40013 Open angle with borderline findings, low risk, bilateral: Secondary | ICD-10-CM | POA: Diagnosis not present

## 2022-02-13 DIAGNOSIS — E559 Vitamin D deficiency, unspecified: Secondary | ICD-10-CM | POA: Diagnosis not present

## 2022-02-13 DIAGNOSIS — H25043 Posterior subcapsular polar age-related cataract, bilateral: Secondary | ICD-10-CM | POA: Diagnosis not present

## 2022-02-13 DIAGNOSIS — H524 Presbyopia: Secondary | ICD-10-CM | POA: Diagnosis not present

## 2022-02-13 DIAGNOSIS — H43813 Vitreous degeneration, bilateral: Secondary | ICD-10-CM | POA: Diagnosis not present

## 2022-02-13 DIAGNOSIS — H25013 Cortical age-related cataract, bilateral: Secondary | ICD-10-CM | POA: Diagnosis not present

## 2022-02-13 DIAGNOSIS — H52203 Unspecified astigmatism, bilateral: Secondary | ICD-10-CM | POA: Diagnosis not present

## 2022-02-13 DIAGNOSIS — H2513 Age-related nuclear cataract, bilateral: Secondary | ICD-10-CM | POA: Diagnosis not present

## 2022-02-13 DIAGNOSIS — E782 Mixed hyperlipidemia: Secondary | ICD-10-CM | POA: Diagnosis not present

## 2022-02-17 DIAGNOSIS — I10 Essential (primary) hypertension: Secondary | ICD-10-CM | POA: Diagnosis not present

## 2022-02-17 DIAGNOSIS — G720 Drug-induced myopathy: Secondary | ICD-10-CM | POA: Diagnosis not present

## 2022-02-17 DIAGNOSIS — E559 Vitamin D deficiency, unspecified: Secondary | ICD-10-CM | POA: Diagnosis not present

## 2022-02-17 DIAGNOSIS — E782 Mixed hyperlipidemia: Secondary | ICD-10-CM | POA: Diagnosis not present

## 2022-03-02 DIAGNOSIS — Z0000 Encounter for general adult medical examination without abnormal findings: Secondary | ICD-10-CM | POA: Diagnosis not present

## 2022-03-02 DIAGNOSIS — Z1331 Encounter for screening for depression: Secondary | ICD-10-CM | POA: Diagnosis not present

## 2022-03-02 DIAGNOSIS — Z1339 Encounter for screening examination for other mental health and behavioral disorders: Secondary | ICD-10-CM | POA: Diagnosis not present

## 2022-03-05 ENCOUNTER — Other Ambulatory Visit: Payer: Self-pay | Admitting: Family Medicine

## 2022-03-05 DIAGNOSIS — M8580 Other specified disorders of bone density and structure, unspecified site: Secondary | ICD-10-CM

## 2022-04-23 DIAGNOSIS — H2511 Age-related nuclear cataract, right eye: Secondary | ICD-10-CM | POA: Diagnosis not present

## 2022-04-23 DIAGNOSIS — H25011 Cortical age-related cataract, right eye: Secondary | ICD-10-CM | POA: Diagnosis not present

## 2022-04-23 DIAGNOSIS — H25811 Combined forms of age-related cataract, right eye: Secondary | ICD-10-CM | POA: Diagnosis not present

## 2022-04-23 DIAGNOSIS — H25041 Posterior subcapsular polar age-related cataract, right eye: Secondary | ICD-10-CM | POA: Diagnosis not present

## 2022-04-23 DIAGNOSIS — H269 Unspecified cataract: Secondary | ICD-10-CM | POA: Diagnosis not present

## 2022-05-14 DIAGNOSIS — H25812 Combined forms of age-related cataract, left eye: Secondary | ICD-10-CM | POA: Diagnosis not present

## 2022-05-14 DIAGNOSIS — H269 Unspecified cataract: Secondary | ICD-10-CM | POA: Diagnosis not present

## 2022-05-14 DIAGNOSIS — H25012 Cortical age-related cataract, left eye: Secondary | ICD-10-CM | POA: Diagnosis not present

## 2022-05-14 DIAGNOSIS — H2512 Age-related nuclear cataract, left eye: Secondary | ICD-10-CM | POA: Diagnosis not present

## 2022-05-14 DIAGNOSIS — H25042 Posterior subcapsular polar age-related cataract, left eye: Secondary | ICD-10-CM | POA: Diagnosis not present

## 2022-06-17 DIAGNOSIS — R109 Unspecified abdominal pain: Secondary | ICD-10-CM | POA: Diagnosis not present

## 2022-06-17 DIAGNOSIS — Z789 Other specified health status: Secondary | ICD-10-CM | POA: Diagnosis not present

## 2022-06-17 DIAGNOSIS — K219 Gastro-esophageal reflux disease without esophagitis: Secondary | ICD-10-CM | POA: Diagnosis not present

## 2022-06-17 DIAGNOSIS — I10 Essential (primary) hypertension: Secondary | ICD-10-CM | POA: Diagnosis not present

## 2022-06-17 DIAGNOSIS — K5900 Constipation, unspecified: Secondary | ICD-10-CM | POA: Diagnosis not present

## 2022-06-17 DIAGNOSIS — J309 Allergic rhinitis, unspecified: Secondary | ICD-10-CM | POA: Diagnosis not present

## 2022-07-08 DIAGNOSIS — K5900 Constipation, unspecified: Secondary | ICD-10-CM | POA: Diagnosis not present

## 2022-07-08 DIAGNOSIS — K219 Gastro-esophageal reflux disease without esophagitis: Secondary | ICD-10-CM | POA: Diagnosis not present

## 2022-07-08 DIAGNOSIS — E782 Mixed hyperlipidemia: Secondary | ICD-10-CM | POA: Diagnosis not present

## 2022-07-08 DIAGNOSIS — R109 Unspecified abdominal pain: Secondary | ICD-10-CM | POA: Diagnosis not present

## 2022-07-22 DIAGNOSIS — K5900 Constipation, unspecified: Secondary | ICD-10-CM | POA: Diagnosis not present

## 2022-07-22 DIAGNOSIS — Z2989 Encounter for other specified prophylactic measures: Secondary | ICD-10-CM | POA: Diagnosis not present

## 2022-07-22 DIAGNOSIS — R109 Unspecified abdominal pain: Secondary | ICD-10-CM | POA: Diagnosis not present

## 2022-08-12 ENCOUNTER — Ambulatory Visit
Admission: RE | Admit: 2022-08-12 | Discharge: 2022-08-12 | Disposition: A | Discharge status: Home / Self Care | Payer: Medicare HMO | Source: Ambulatory Visit | Attending: Family Medicine | Admitting: Family Medicine

## 2022-08-12 DIAGNOSIS — M8588 Other specified disorders of bone density and structure, other site: Secondary | ICD-10-CM | POA: Diagnosis not present

## 2022-08-12 DIAGNOSIS — E349 Endocrine disorder, unspecified: Secondary | ICD-10-CM | POA: Diagnosis not present

## 2022-08-12 DIAGNOSIS — N958 Other specified menopausal and perimenopausal disorders: Secondary | ICD-10-CM | POA: Diagnosis not present

## 2022-08-12 DIAGNOSIS — Z90722 Acquired absence of ovaries, bilateral: Secondary | ICD-10-CM | POA: Diagnosis not present

## 2022-08-12 DIAGNOSIS — M8580 Other specified disorders of bone density and structure, unspecified site: Secondary | ICD-10-CM

## 2022-08-17 DIAGNOSIS — E559 Vitamin D deficiency, unspecified: Secondary | ICD-10-CM | POA: Diagnosis not present

## 2022-08-17 DIAGNOSIS — D519 Vitamin B12 deficiency anemia, unspecified: Secondary | ICD-10-CM | POA: Diagnosis not present

## 2022-08-17 DIAGNOSIS — E782 Mixed hyperlipidemia: Secondary | ICD-10-CM | POA: Diagnosis not present

## 2022-08-17 DIAGNOSIS — I10 Essential (primary) hypertension: Secondary | ICD-10-CM | POA: Diagnosis not present

## 2022-08-24 DIAGNOSIS — Z789 Other specified health status: Secondary | ICD-10-CM | POA: Diagnosis not present

## 2022-08-24 DIAGNOSIS — E559 Vitamin D deficiency, unspecified: Secondary | ICD-10-CM | POA: Diagnosis not present

## 2022-08-24 DIAGNOSIS — K5900 Constipation, unspecified: Secondary | ICD-10-CM | POA: Diagnosis not present

## 2022-08-24 DIAGNOSIS — E782 Mixed hyperlipidemia: Secondary | ICD-10-CM | POA: Diagnosis not present

## 2022-08-24 DIAGNOSIS — M8580 Other specified disorders of bone density and structure, unspecified site: Secondary | ICD-10-CM | POA: Diagnosis not present

## 2022-08-24 DIAGNOSIS — I10 Essential (primary) hypertension: Secondary | ICD-10-CM | POA: Diagnosis not present

## 2022-08-24 DIAGNOSIS — G720 Drug-induced myopathy: Secondary | ICD-10-CM | POA: Diagnosis not present

## 2022-09-10 ENCOUNTER — Other Ambulatory Visit: Payer: Self-pay | Admitting: Family Medicine

## 2022-09-10 DIAGNOSIS — Z1231 Encounter for screening mammogram for malignant neoplasm of breast: Secondary | ICD-10-CM

## 2022-09-15 ENCOUNTER — Ambulatory Visit
Admission: RE | Admit: 2022-09-15 | Discharge: 2022-09-15 | Disposition: A | Discharge status: Home / Self Care | Payer: Medicare HMO | Source: Ambulatory Visit | Attending: Family Medicine | Admitting: Family Medicine

## 2022-09-15 DIAGNOSIS — Z1231 Encounter for screening mammogram for malignant neoplasm of breast: Secondary | ICD-10-CM

## 2023-02-24 DIAGNOSIS — Z1331 Encounter for screening for depression: Secondary | ICD-10-CM | POA: Diagnosis not present

## 2023-02-24 DIAGNOSIS — E782 Mixed hyperlipidemia: Secondary | ICD-10-CM | POA: Diagnosis not present

## 2023-02-24 DIAGNOSIS — Z0000 Encounter for general adult medical examination without abnormal findings: Secondary | ICD-10-CM | POA: Diagnosis not present

## 2023-02-24 DIAGNOSIS — G4700 Insomnia, unspecified: Secondary | ICD-10-CM | POA: Diagnosis not present

## 2023-02-24 DIAGNOSIS — G720 Drug-induced myopathy: Secondary | ICD-10-CM | POA: Diagnosis not present

## 2023-02-24 DIAGNOSIS — Z1339 Encounter for screening examination for other mental health and behavioral disorders: Secondary | ICD-10-CM | POA: Diagnosis not present

## 2023-02-24 DIAGNOSIS — L918 Other hypertrophic disorders of the skin: Secondary | ICD-10-CM | POA: Diagnosis not present

## 2023-03-02 IMAGING — MG MM DIGITAL SCREENING BILAT W/ TOMO AND CAD
6 of 12 series · 6 of 36 positions shown · non-contrast
Comparison: Previous exam(s).

CLINICAL DATA: Screening.

EXAM:
DIGITAL SCREENING BILATERAL MAMMOGRAM WITH TOMOSYNTHESIS AND CAD
TECHNIQUE: Bilateral screening digital craniocaudal and mediolateral oblique
mammograms were obtained. Bilateral screening digital breast
tomosynthesis was performed. The images were evaluated with
computer-aided detection.

[R CC synth-2D (1 of 2)]
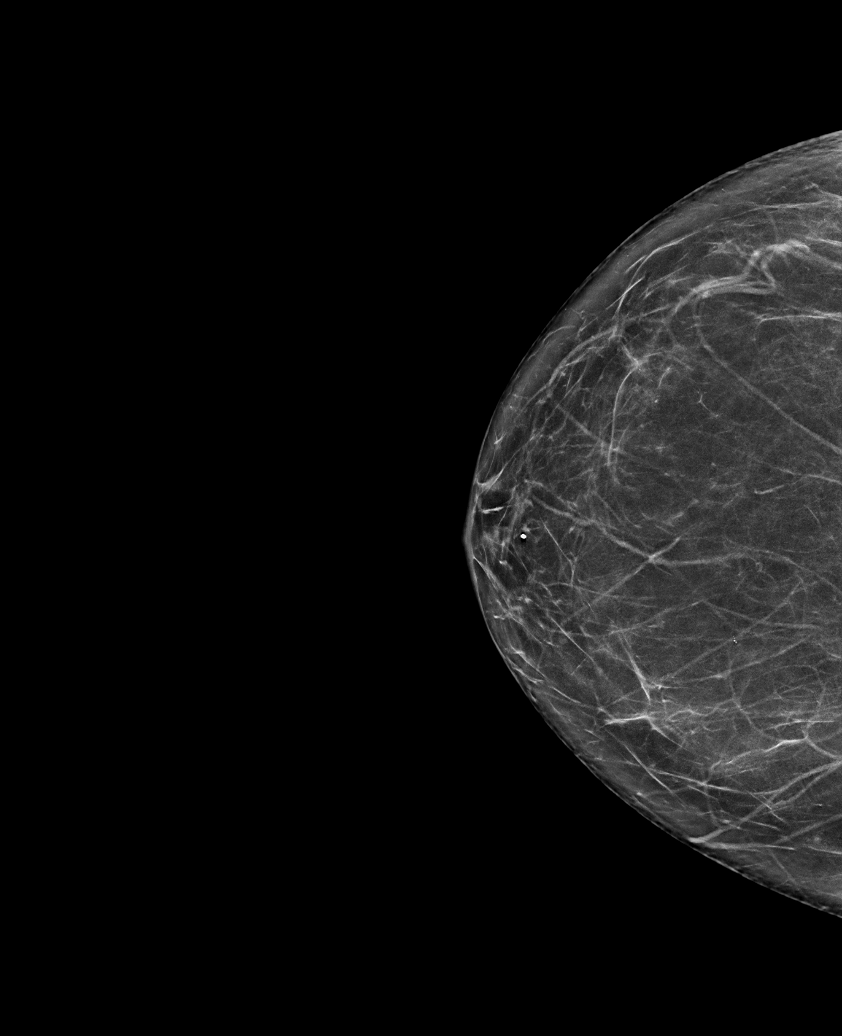

[R CC synth-2D (2 of 2)]
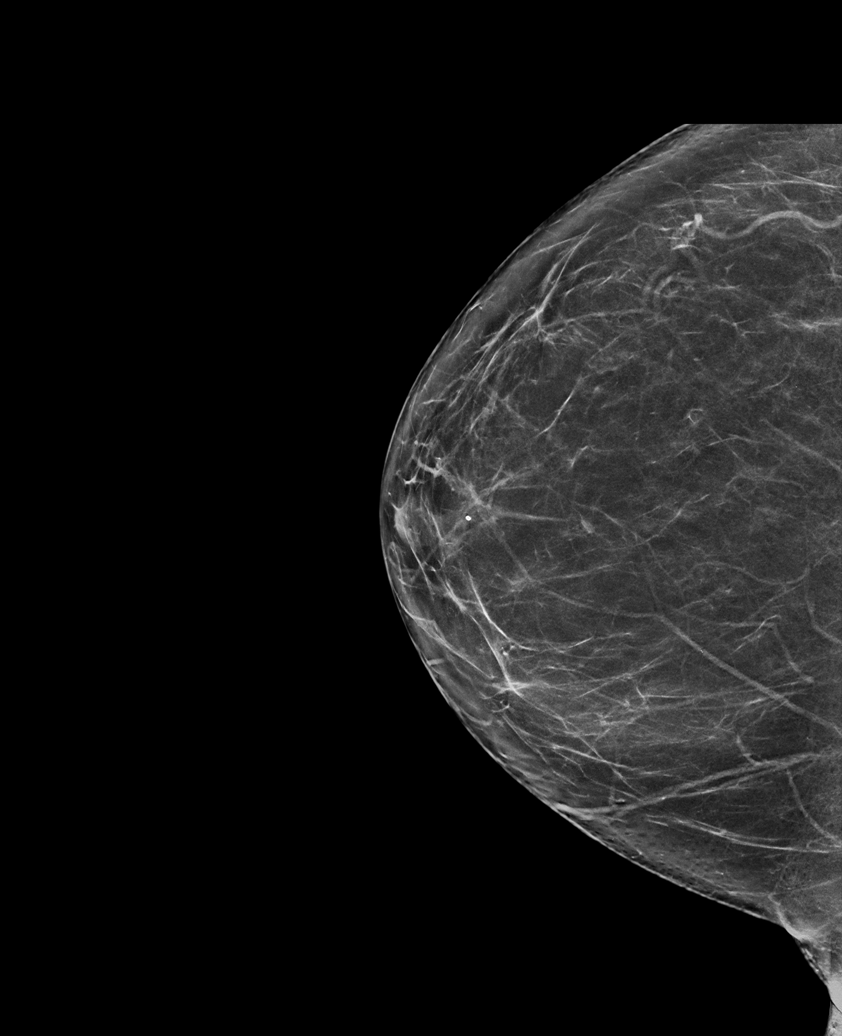

[L CC synth-2D (1 of 2)]
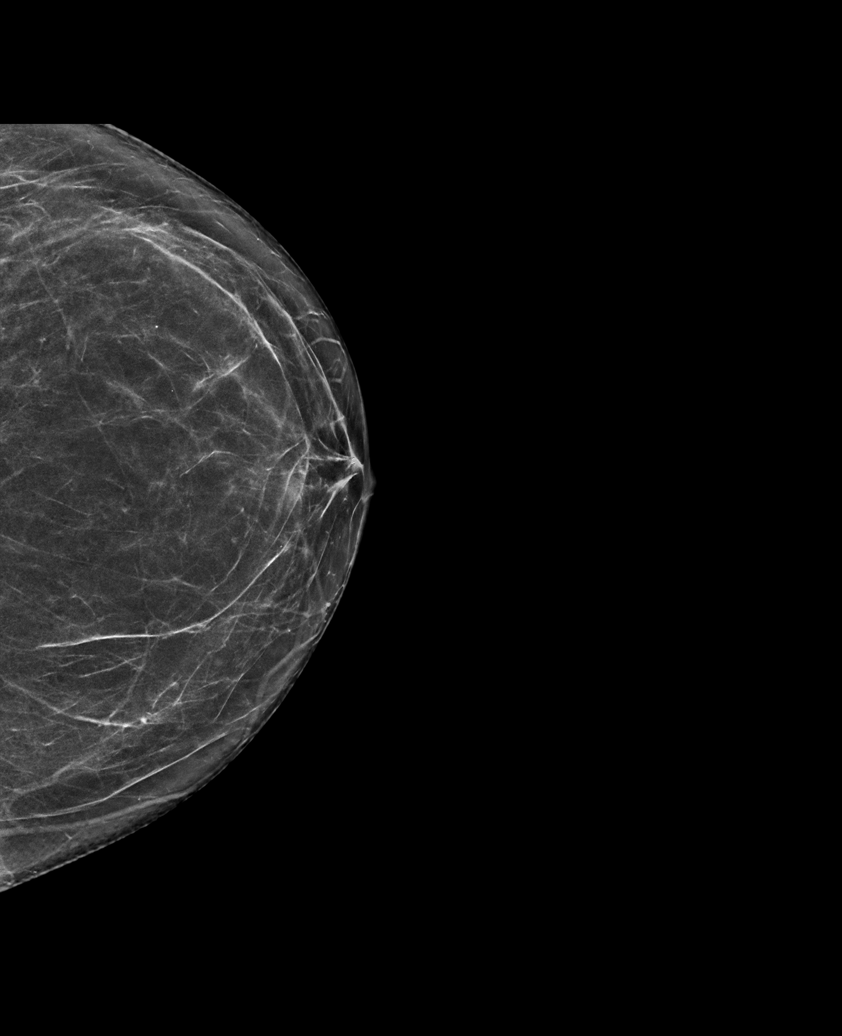

[L MLO synth-2D]
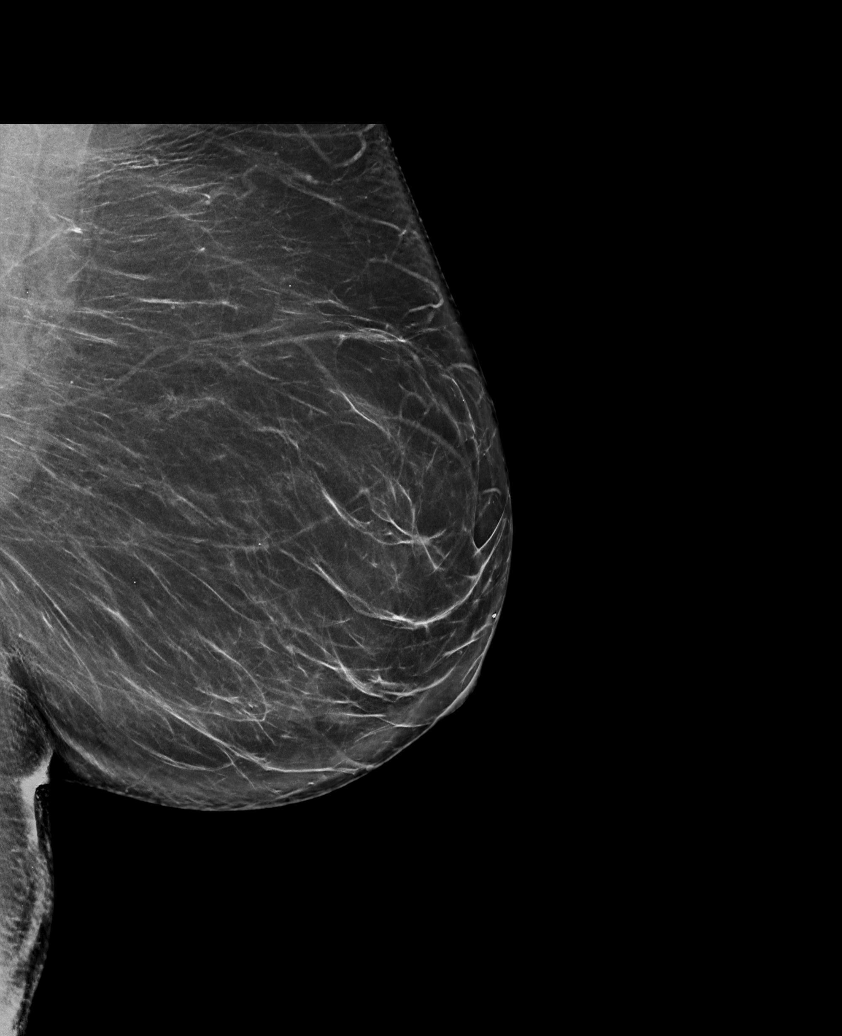

[L CC synth-2D (2 of 2)]
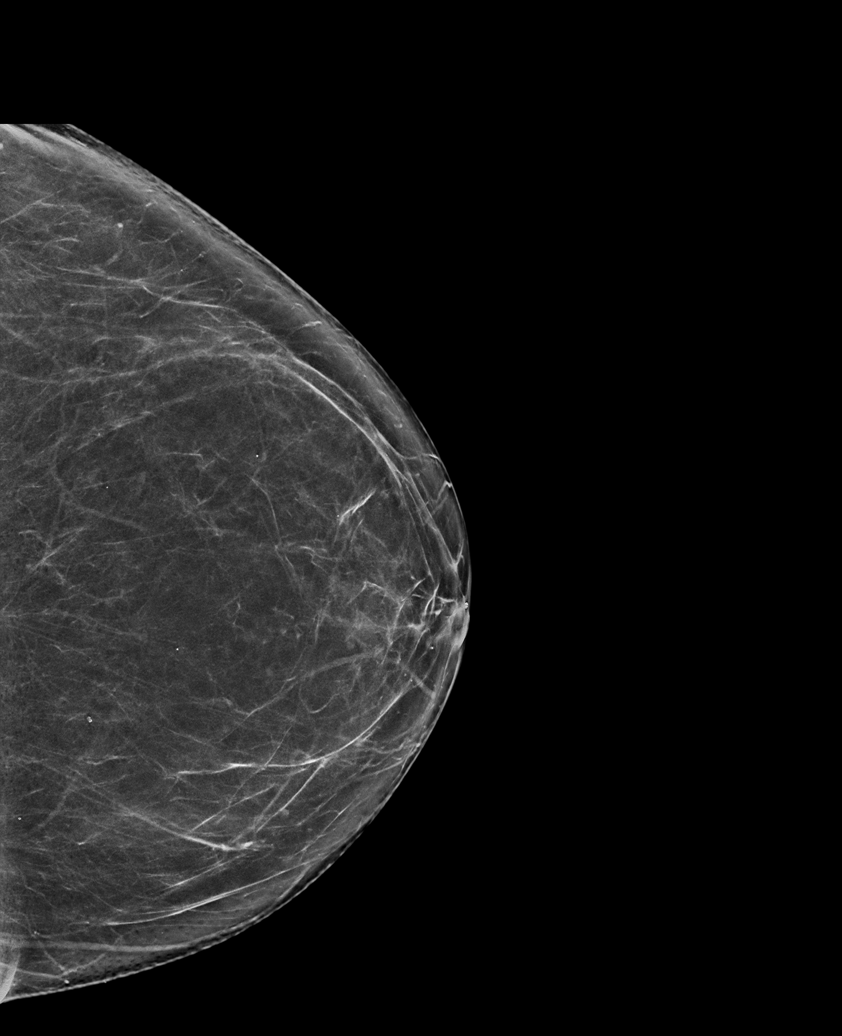

[R MLO synth-2D]
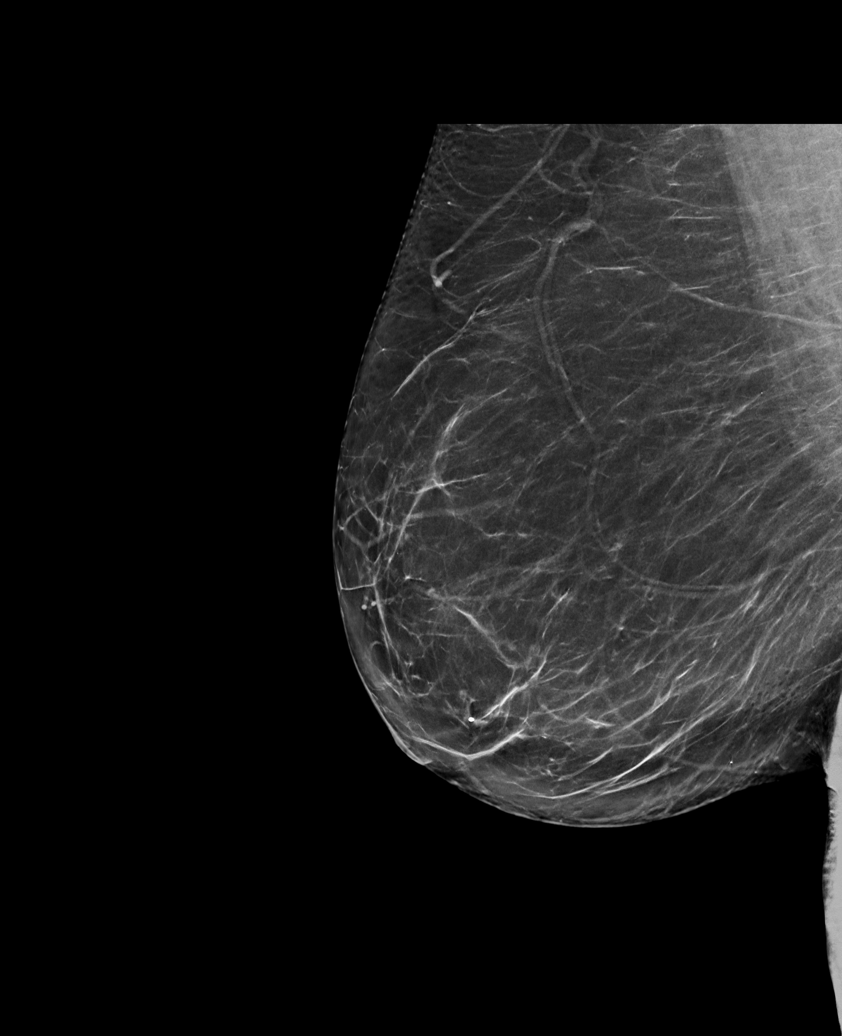

[6 of 36 positions shown; findings below may reference images not displayed]

ACR Breast Density Category b: There are scattered areas of
fibroglandular density.
FINDINGS: There are no findings suspicious for malignancy.
IMPRESSION: No mammographic evidence of malignancy. A result letter of this
screening mammogram will be mailed directly to the patient.

RECOMMENDATION:
Screening mammogram in one year. (Code:51-O-LD2)

BI-RADS CATEGORY  1: Negative.

## 2023-04-05 DIAGNOSIS — D649 Anemia, unspecified: Secondary | ICD-10-CM | POA: Diagnosis not present

## 2023-04-05 DIAGNOSIS — E559 Vitamin D deficiency, unspecified: Secondary | ICD-10-CM | POA: Diagnosis not present

## 2023-04-05 DIAGNOSIS — E782 Mixed hyperlipidemia: Secondary | ICD-10-CM | POA: Diagnosis not present

## 2023-04-05 DIAGNOSIS — I10 Essential (primary) hypertension: Secondary | ICD-10-CM | POA: Diagnosis not present

## 2023-04-05 DIAGNOSIS — R5383 Other fatigue: Secondary | ICD-10-CM | POA: Diagnosis not present

## 2023-04-12 DIAGNOSIS — I10 Essential (primary) hypertension: Secondary | ICD-10-CM | POA: Diagnosis not present

## 2023-04-12 DIAGNOSIS — R5383 Other fatigue: Secondary | ICD-10-CM | POA: Diagnosis not present

## 2023-04-12 DIAGNOSIS — M62838 Other muscle spasm: Secondary | ICD-10-CM | POA: Diagnosis not present

## 2023-04-15 DIAGNOSIS — E669 Obesity, unspecified: Secondary | ICD-10-CM | POA: Diagnosis not present

## 2023-04-15 DIAGNOSIS — E782 Mixed hyperlipidemia: Secondary | ICD-10-CM | POA: Diagnosis not present

## 2023-04-15 DIAGNOSIS — R5383 Other fatigue: Secondary | ICD-10-CM | POA: Diagnosis not present

## 2023-04-15 DIAGNOSIS — I10 Essential (primary) hypertension: Secondary | ICD-10-CM | POA: Diagnosis not present

## 2023-05-07 DIAGNOSIS — I10 Essential (primary) hypertension: Secondary | ICD-10-CM | POA: Diagnosis not present

## 2023-05-07 DIAGNOSIS — J309 Allergic rhinitis, unspecified: Secondary | ICD-10-CM | POA: Diagnosis not present

## 2023-05-07 DIAGNOSIS — R5383 Other fatigue: Secondary | ICD-10-CM | POA: Diagnosis not present

## 2023-05-17 DIAGNOSIS — R5383 Other fatigue: Secondary | ICD-10-CM | POA: Diagnosis not present

## 2023-05-17 DIAGNOSIS — E559 Vitamin D deficiency, unspecified: Secondary | ICD-10-CM | POA: Diagnosis not present

## 2023-05-17 DIAGNOSIS — E785 Hyperlipidemia, unspecified: Secondary | ICD-10-CM | POA: Diagnosis not present

## 2023-05-24 DIAGNOSIS — I10 Essential (primary) hypertension: Secondary | ICD-10-CM | POA: Diagnosis not present

## 2023-05-24 DIAGNOSIS — Z0000 Encounter for general adult medical examination without abnormal findings: Secondary | ICD-10-CM | POA: Diagnosis not present

## 2023-05-24 DIAGNOSIS — G720 Drug-induced myopathy: Secondary | ICD-10-CM | POA: Diagnosis not present

## 2023-05-24 DIAGNOSIS — E559 Vitamin D deficiency, unspecified: Secondary | ICD-10-CM | POA: Diagnosis not present

## 2023-05-24 DIAGNOSIS — Z1331 Encounter for screening for depression: Secondary | ICD-10-CM | POA: Diagnosis not present

## 2023-05-24 DIAGNOSIS — Z1339 Encounter for screening examination for other mental health and behavioral disorders: Secondary | ICD-10-CM | POA: Diagnosis not present

## 2023-05-24 DIAGNOSIS — E782 Mixed hyperlipidemia: Secondary | ICD-10-CM | POA: Diagnosis not present

## 2023-05-24 DIAGNOSIS — Z789 Other specified health status: Secondary | ICD-10-CM | POA: Diagnosis not present

## 2023-05-24 DIAGNOSIS — M25812 Other specified joint disorders, left shoulder: Secondary | ICD-10-CM | POA: Diagnosis not present

## 2023-05-25 DIAGNOSIS — I10 Essential (primary) hypertension: Secondary | ICD-10-CM | POA: Diagnosis not present

## 2023-05-25 DIAGNOSIS — F410 Panic disorder [episodic paroxysmal anxiety] without agoraphobia: Secondary | ICD-10-CM | POA: Diagnosis not present

## 2023-05-25 DIAGNOSIS — F331 Major depressive disorder, recurrent, moderate: Secondary | ICD-10-CM | POA: Diagnosis not present

## 2023-05-25 DIAGNOSIS — G4700 Insomnia, unspecified: Secondary | ICD-10-CM | POA: Diagnosis not present

## 2023-05-25 DIAGNOSIS — M7542 Impingement syndrome of left shoulder: Secondary | ICD-10-CM | POA: Diagnosis not present

## 2023-05-25 DIAGNOSIS — F411 Generalized anxiety disorder: Secondary | ICD-10-CM | POA: Diagnosis not present

## 2023-06-10 DIAGNOSIS — M1712 Unilateral primary osteoarthritis, left knee: Secondary | ICD-10-CM | POA: Diagnosis not present

## 2023-06-12 DIAGNOSIS — M25569 Pain in unspecified knee: Secondary | ICD-10-CM | POA: Diagnosis not present

## 2023-06-14 DIAGNOSIS — M25562 Pain in left knee: Secondary | ICD-10-CM | POA: Diagnosis not present

## 2023-06-21 DIAGNOSIS — F331 Major depressive disorder, recurrent, moderate: Secondary | ICD-10-CM | POA: Diagnosis not present

## 2023-06-21 DIAGNOSIS — G4700 Insomnia, unspecified: Secondary | ICD-10-CM | POA: Diagnosis not present

## 2023-06-21 DIAGNOSIS — I10 Essential (primary) hypertension: Secondary | ICD-10-CM | POA: Diagnosis not present

## 2023-06-21 DIAGNOSIS — F411 Generalized anxiety disorder: Secondary | ICD-10-CM | POA: Diagnosis not present

## 2023-06-21 DIAGNOSIS — M179 Osteoarthritis of knee, unspecified: Secondary | ICD-10-CM | POA: Diagnosis not present

## 2023-07-22 DIAGNOSIS — M25562 Pain in left knee: Secondary | ICD-10-CM | POA: Diagnosis not present

## 2023-07-26 DIAGNOSIS — G4700 Insomnia, unspecified: Secondary | ICD-10-CM | POA: Diagnosis not present

## 2023-07-26 DIAGNOSIS — Z79891 Long term (current) use of opiate analgesic: Secondary | ICD-10-CM | POA: Diagnosis not present

## 2023-07-26 DIAGNOSIS — M170 Bilateral primary osteoarthritis of knee: Secondary | ICD-10-CM | POA: Diagnosis not present

## 2023-07-29 DIAGNOSIS — M25562 Pain in left knee: Secondary | ICD-10-CM | POA: Diagnosis not present

## 2023-08-12 DIAGNOSIS — M6281 Muscle weakness (generalized): Secondary | ICD-10-CM | POA: Diagnosis not present

## 2023-08-12 DIAGNOSIS — M1712 Unilateral primary osteoarthritis, left knee: Secondary | ICD-10-CM | POA: Diagnosis not present

## 2023-08-16 ENCOUNTER — Other Ambulatory Visit: Payer: Self-pay | Admitting: Family Medicine

## 2023-08-16 DIAGNOSIS — Z1231 Encounter for screening mammogram for malignant neoplasm of breast: Secondary | ICD-10-CM

## 2023-08-20 DIAGNOSIS — I10 Essential (primary) hypertension: Secondary | ICD-10-CM | POA: Diagnosis not present

## 2023-08-20 DIAGNOSIS — R5383 Other fatigue: Secondary | ICD-10-CM | POA: Diagnosis not present

## 2023-08-20 DIAGNOSIS — E785 Hyperlipidemia, unspecified: Secondary | ICD-10-CM | POA: Diagnosis not present

## 2023-08-25 DIAGNOSIS — I10 Essential (primary) hypertension: Secondary | ICD-10-CM | POA: Diagnosis not present

## 2023-08-25 DIAGNOSIS — K5900 Constipation, unspecified: Secondary | ICD-10-CM | POA: Diagnosis not present

## 2023-08-25 DIAGNOSIS — Z79899 Other long term (current) drug therapy: Secondary | ICD-10-CM | POA: Diagnosis not present

## 2023-08-26 DIAGNOSIS — M6281 Muscle weakness (generalized): Secondary | ICD-10-CM | POA: Diagnosis not present

## 2023-08-26 DIAGNOSIS — M1712 Unilateral primary osteoarthritis, left knee: Secondary | ICD-10-CM | POA: Diagnosis not present

## 2023-08-31 DIAGNOSIS — Z0000 Encounter for general adult medical examination without abnormal findings: Secondary | ICD-10-CM | POA: Diagnosis not present

## 2023-09-29 DIAGNOSIS — I10 Essential (primary) hypertension: Secondary | ICD-10-CM | POA: Diagnosis not present

## 2023-09-29 DIAGNOSIS — K5900 Constipation, unspecified: Secondary | ICD-10-CM | POA: Diagnosis not present

## 2023-10-01 ENCOUNTER — Ambulatory Visit
Admission: RE | Admit: 2023-10-01 | Discharge: 2023-10-01 | Disposition: A | Discharge status: Home / Self Care | Source: Ambulatory Visit | Attending: Family Medicine | Admitting: Family Medicine

## 2023-10-01 DIAGNOSIS — Z1231 Encounter for screening mammogram for malignant neoplasm of breast: Secondary | ICD-10-CM

## 2023-10-01 DIAGNOSIS — D2261 Melanocytic nevi of right upper limb, including shoulder: Secondary | ICD-10-CM | POA: Diagnosis not present

## 2023-10-01 DIAGNOSIS — Z1283 Encounter for screening for malignant neoplasm of skin: Secondary | ICD-10-CM | POA: Diagnosis not present

## 2023-10-01 DIAGNOSIS — L814 Other melanin hyperpigmentation: Secondary | ICD-10-CM | POA: Diagnosis not present

## 2023-10-01 DIAGNOSIS — D2272 Melanocytic nevi of left lower limb, including hip: Secondary | ICD-10-CM | POA: Diagnosis not present

## 2023-10-01 DIAGNOSIS — R208 Other disturbances of skin sensation: Secondary | ICD-10-CM | POA: Diagnosis not present

## 2023-10-01 DIAGNOSIS — L538 Other specified erythematous conditions: Secondary | ICD-10-CM | POA: Diagnosis not present

## 2023-10-01 DIAGNOSIS — L821 Other seborrheic keratosis: Secondary | ICD-10-CM | POA: Diagnosis not present

## 2023-10-01 DIAGNOSIS — D225 Melanocytic nevi of trunk: Secondary | ICD-10-CM | POA: Diagnosis not present

## 2023-10-01 DIAGNOSIS — L820 Inflamed seborrheic keratosis: Secondary | ICD-10-CM | POA: Diagnosis not present

## 2023-10-19 DIAGNOSIS — N3000 Acute cystitis without hematuria: Secondary | ICD-10-CM | POA: Diagnosis not present

## 2023-10-19 DIAGNOSIS — I10 Essential (primary) hypertension: Secondary | ICD-10-CM | POA: Diagnosis not present

## 2023-11-02 DIAGNOSIS — R252 Cramp and spasm: Secondary | ICD-10-CM | POA: Diagnosis not present

## 2023-11-02 DIAGNOSIS — Z79624 Long term (current) use of inhibitors of nucleotide synthesis: Secondary | ICD-10-CM | POA: Diagnosis not present

## 2023-11-02 DIAGNOSIS — K5900 Constipation, unspecified: Secondary | ICD-10-CM | POA: Diagnosis not present

## 2023-11-02 DIAGNOSIS — I10 Essential (primary) hypertension: Secondary | ICD-10-CM | POA: Diagnosis not present

## 2023-12-07 DIAGNOSIS — R32 Unspecified urinary incontinence: Secondary | ICD-10-CM | POA: Diagnosis not present

## 2023-12-07 DIAGNOSIS — G4700 Insomnia, unspecified: Secondary | ICD-10-CM | POA: Diagnosis not present

## 2023-12-07 DIAGNOSIS — K5900 Constipation, unspecified: Secondary | ICD-10-CM | POA: Diagnosis not present

## 2023-12-07 DIAGNOSIS — I10 Essential (primary) hypertension: Secondary | ICD-10-CM | POA: Diagnosis not present

## 2023-12-08 DIAGNOSIS — M170 Bilateral primary osteoarthritis of knee: Secondary | ICD-10-CM | POA: Diagnosis not present

## 2023-12-09 DIAGNOSIS — M1711 Unilateral primary osteoarthritis, right knee: Secondary | ICD-10-CM | POA: Diagnosis not present

## 2023-12-09 DIAGNOSIS — M1712 Unilateral primary osteoarthritis, left knee: Secondary | ICD-10-CM | POA: Diagnosis not present

## 2024-01-03 DIAGNOSIS — M19032 Primary osteoarthritis, left wrist: Secondary | ICD-10-CM | POA: Diagnosis not present

## 2024-01-03 DIAGNOSIS — K5900 Constipation, unspecified: Secondary | ICD-10-CM | POA: Diagnosis not present

## 2024-01-03 DIAGNOSIS — I10 Essential (primary) hypertension: Secondary | ICD-10-CM | POA: Diagnosis not present
